# Patient Record
Sex: Female | Born: 1970 | Race: White | Hispanic: No | Marital: Married | Smoking: Current every day smoker
Health system: Southern US, Community
[De-identification: ages and names within clinical notes are randomized; demographics above are authoritative.]

## PROBLEM LIST (undated history)

## (undated) DIAGNOSIS — J439 Emphysema, unspecified: Secondary | ICD-10-CM

## (undated) DIAGNOSIS — D649 Anemia, unspecified: Secondary | ICD-10-CM

## (undated) DIAGNOSIS — Z5189 Encounter for other specified aftercare: Secondary | ICD-10-CM

## (undated) DIAGNOSIS — F4310 Post-traumatic stress disorder, unspecified: Secondary | ICD-10-CM

## (undated) DIAGNOSIS — M1990 Unspecified osteoarthritis, unspecified site: Secondary | ICD-10-CM

## (undated) DIAGNOSIS — M5126 Other intervertebral disc displacement, lumbar region: Secondary | ICD-10-CM

## (undated) DIAGNOSIS — D352 Benign neoplasm of pituitary gland: Secondary | ICD-10-CM

## (undated) DIAGNOSIS — M359 Systemic involvement of connective tissue, unspecified: Secondary | ICD-10-CM

## (undated) DIAGNOSIS — F319 Bipolar disorder, unspecified: Secondary | ICD-10-CM

## (undated) DIAGNOSIS — G629 Polyneuropathy, unspecified: Secondary | ICD-10-CM

## (undated) DIAGNOSIS — G8929 Other chronic pain: Secondary | ICD-10-CM

## (undated) DIAGNOSIS — IMO0002 Reserved for concepts with insufficient information to code with codable children: Secondary | ICD-10-CM

## (undated) DIAGNOSIS — M549 Dorsalgia, unspecified: Secondary | ICD-10-CM

## (undated) DIAGNOSIS — E785 Hyperlipidemia, unspecified: Secondary | ICD-10-CM

## (undated) DIAGNOSIS — K219 Gastro-esophageal reflux disease without esophagitis: Secondary | ICD-10-CM

## (undated) DIAGNOSIS — F410 Panic disorder [episodic paroxysmal anxiety] without agoraphobia: Secondary | ICD-10-CM

## (undated) DIAGNOSIS — M069 Rheumatoid arthritis, unspecified: Secondary | ICD-10-CM

## (undated) HISTORY — PX: DENTAL SURGERY: SHX609

## (undated) HISTORY — DX: Hyperlipidemia, unspecified: E78.5

## (undated) HISTORY — PX: TUBAL LIGATION: SHX77

## (undated) HISTORY — PX: HIP SURGERY: SHX245

## (undated) HISTORY — DX: Emphysema, unspecified: J43.9

## (undated) HISTORY — DX: Anemia, unspecified: D64.9

## (undated) HISTORY — PX: NECK SURGERY: SHX720

## (undated) HISTORY — DX: Post-traumatic stress disorder, unspecified: F43.10

## (undated) HISTORY — DX: Encounter for other specified aftercare: Z51.89

## (undated) HISTORY — DX: Gastro-esophageal reflux disease without esophagitis: K21.9

## (undated) HISTORY — PX: ABDOMINAL SURGERY: SHX537

---

## 2006-10-28 ENCOUNTER — Emergency Department (HOSPITAL_COMMUNITY): Admission: EM | Admit: 2006-10-28 | Discharge: 2006-10-28 | Payer: Self-pay | Admitting: Emergency Medicine

## 2008-05-19 ENCOUNTER — Other Ambulatory Visit: Admission: RE | Admit: 2008-05-19 | Discharge: 2008-05-19 | Payer: Self-pay | Admitting: Obstetrics and Gynecology

## 2008-05-23 ENCOUNTER — Ambulatory Visit (HOSPITAL_COMMUNITY): Admission: RE | Admit: 2008-05-23 | Discharge: 2008-05-23 | Payer: Self-pay | Admitting: Obstetrics and Gynecology

## 2010-03-28 ENCOUNTER — Ambulatory Visit (HOSPITAL_COMMUNITY): Admission: RE | Admit: 2010-03-28 | Discharge: 2010-03-28 | Payer: Self-pay | Admitting: Obstetrics and Gynecology

## 2011-05-20 ENCOUNTER — Other Ambulatory Visit: Payer: Self-pay | Admitting: Obstetrics and Gynecology

## 2011-10-15 ENCOUNTER — Other Ambulatory Visit: Payer: Self-pay | Admitting: Obstetrics and Gynecology

## 2012-01-26 ENCOUNTER — Other Ambulatory Visit: Payer: Self-pay | Admitting: Obstetrics and Gynecology

## 2012-03-08 ENCOUNTER — Other Ambulatory Visit (HOSPITAL_COMMUNITY): Payer: Self-pay | Admitting: Orthopaedic Surgery

## 2012-03-08 DIAGNOSIS — M25569 Pain in unspecified knee: Secondary | ICD-10-CM

## 2012-03-11 ENCOUNTER — Ambulatory Visit (HOSPITAL_COMMUNITY)
Admission: RE | Admit: 2012-03-11 | Discharge: 2012-03-11 | Disposition: A | Discharge status: Home / Self Care | Payer: Medicare Other | Source: Ambulatory Visit | Attending: Orthopaedic Surgery | Admitting: Orthopaedic Surgery

## 2012-03-11 DIAGNOSIS — M25569 Pain in unspecified knee: Secondary | ICD-10-CM

## 2012-05-16 ENCOUNTER — Emergency Department (HOSPITAL_COMMUNITY)
Admission: EM | Admit: 2012-05-16 | Discharge: 2012-05-16 | Disposition: A | Discharge status: Home / Self Care | Payer: Medicare Other | Attending: Emergency Medicine | Admitting: Emergency Medicine

## 2012-05-16 ENCOUNTER — Encounter (HOSPITAL_COMMUNITY): Payer: Self-pay

## 2012-05-16 DIAGNOSIS — R51 Headache: Secondary | ICD-10-CM | POA: Insufficient documentation

## 2012-05-16 DIAGNOSIS — Z8739 Personal history of other diseases of the musculoskeletal system and connective tissue: Secondary | ICD-10-CM | POA: Insufficient documentation

## 2012-05-16 DIAGNOSIS — Z79899 Other long term (current) drug therapy: Secondary | ICD-10-CM | POA: Insufficient documentation

## 2012-05-16 DIAGNOSIS — J40 Bronchitis, not specified as acute or chronic: Secondary | ICD-10-CM

## 2012-05-16 HISTORY — DX: Bipolar disorder, unspecified: F31.9

## 2012-05-16 HISTORY — DX: Unspecified osteoarthritis, unspecified site: M19.90

## 2012-05-16 HISTORY — DX: Reserved for concepts with insufficient information to code with codable children: IMO0002

## 2012-05-16 HISTORY — DX: Panic disorder (episodic paroxysmal anxiety): F41.0

## 2012-05-16 MED ORDER — IPRATROPIUM BROMIDE 0.02 % IN SOLN
0.5000 mg | Freq: Once | RESPIRATORY_TRACT | Status: AC
  Administered 2012-05-16: 0.5 mg via RESPIRATORY_TRACT
  Filled 2012-05-16: qty 2.5

## 2012-05-16 MED ORDER — HYDROCOD POLST-CHLORPHEN POLST 10-8 MG/5ML PO LQCR
5.0000 mL | Freq: Once | ORAL | Status: AC
  Administered 2012-05-16: 5 mL via ORAL
  Filled 2012-05-16: qty 5

## 2012-05-16 MED ORDER — DOXYCYCLINE HYCLATE 100 MG PO CAPS: 100.0000 mg | ORAL_CAPSULE | Freq: Two times a day (BID) | ORAL | Status: DC

## 2012-05-16 MED ORDER — METHYLPREDNISOLONE SODIUM SUCC 125 MG IJ SOLR
125.0000 mg | Freq: Once | INTRAMUSCULAR | Status: AC
  Administered 2012-05-16: 125 mg via INTRAMUSCULAR
  Filled 2012-05-16: qty 2

## 2012-05-16 MED ORDER — PREDNISONE 10 MG PO TABS: ORAL_TABLET | ORAL | Status: DC

## 2012-05-16 MED ORDER — ALBUTEROL SULFATE HFA 108 (90 BASE) MCG/ACT IN AERS: 2.0000 | INHALATION_SPRAY | RESPIRATORY_TRACT | Status: DC

## 2012-05-16 MED ORDER — HYDROCOD POLST-CHLORPHEN POLST 10-8 MG/5ML PO LQCR: 5.0000 mL | Freq: Two times a day (BID) | ORAL | Status: DC | PRN

## 2012-05-16 MED ORDER — ALBUTEROL SULFATE (5 MG/ML) 0.5% IN NEBU
5.0000 mg | INHALATION_SOLUTION | Freq: Once | RESPIRATORY_TRACT | Status: AC
  Administered 2012-05-16: 5 mg via RESPIRATORY_TRACT
  Filled 2012-05-16: qty 1

## 2012-05-16 NOTE — ED Provider Notes (Signed)
Medical screening examination/treatment/procedure(s) were performed by non-physician practitioner and as supervising physician I was immediately available for consultation/collaboration.   Joya Gaskins, MD 05/16/12 [phone]

## 2012-05-16 NOTE — ED Notes (Signed)
Pt reports being sick for 1 1/2 weeks with cough, congestion and sore throat.

## 2012-05-16 NOTE — ED Provider Notes (Signed)
History     CSN: [id number]  Arrival date & time 05/16/12  1235   First MD Initiated Contact with Patient 05/16/12 1331      Chief Complaint  Patient presents with  . Sore Throat  . Cough    (Consider location/radiation/quality/duration/timing/severity/associated sxs/prior treatment) Patient is a 41 y.o. female presenting with cough. The history is provided by the patient.  Cough This is a new problem. The current episode started more than 1 week ago. The problem occurs every few minutes. The problem has been gradually worsening. The cough is productive of sputum. There has been no fever. Associated symptoms include chills, headaches, sore throat, myalgias, shortness of breath and wheezing. Pertinent negatives include no chest pain. She has tried cough syrup and decongestants for the symptoms. The treatment provided no relief. She is a smoker. Her past medical history is significant for pneumonia.    Past Medical History  Diagnosis Date  . Cancer   . Arthritis   . DDD (degenerative disc disease)   . Bipolar 1 disorder   . Panic attacks     Past Surgical History  Procedure Date  . Abdominal surgery   . Tubal ligation     No family history on file.  History  Substance Use Topics  . Smoking status: Current Every Day Smoker    Types: Cigarettes  . Smokeless tobacco: Not on file  . Alcohol Use: No    OB History    Grav Para Term Preterm Abortions TAB SAB Ect Mult Living                  Review of Systems  Constitutional: Positive for chills. Negative for activity change.       All ROS Neg except as noted in HPI  HENT: Positive for sore throat. Negative for nosebleeds and neck pain.   Eyes: Negative for photophobia and discharge.  Respiratory: Positive for cough, shortness of breath and wheezing.   Cardiovascular: Negative for chest pain and palpitations.  Gastrointestinal: Negative for abdominal pain and blood in stool.  Genitourinary: Negative for dysuria,  frequency and hematuria.  Musculoskeletal: Positive for myalgias. Negative for back pain and arthralgias.  Skin: Negative.   Neurological: Positive for headaches. Negative for dizziness, seizures and speech difficulty.  Psychiatric/Behavioral: Negative for hallucinations and confusion.    Allergies  Codeine  Home Medications   Current Outpatient Rx  Name Route Sig Dispense Refill  . ALPRAZOLAM 0.5 MG PO TABS Oral Take 1 tablet by mouth 4 times daily.    . ARIPIPRAZOLE 5 MG PO TABS Oral Take 5 mg by mouth daily.    Marland Kitchen DEXTROMETHORPHAN POLISTIREX ER 30 MG/5ML PO LQCR Oral Take 50 mg by mouth every 4 (four) hours as needed. Cough.    . OXYCODONE HCL 5 MG PO TABS Oral Take 1 tablet by mouth 4 (four) times daily as needed. Breakthrough pain.    Marland Kitchen PHENTERMINE HCL 37.5 MG PO TABS Oral Take 37.5 mg by mouth daily before breakfast.    . MUCINEX FAST-MAX COLD FLU PO Oral Take 1 tablet by mouth 2 (two) times daily.    . ALBUTEROL SULFATE HFA 108 (90 BASE) MCG/ACT IN AERS Inhalation Inhale 2 puffs into the lungs every 4 (four) hours. 1 Inhaler 0  . HYDROCOD POLST-CPM POLST ER 10-8 MG/5ML PO LQCR Oral Take 5 mLs by mouth every 12 (twelve) hours as needed. 140 mL 0  . DOXYCYCLINE HYCLATE 100 MG PO CAPS Oral Take 1 capsule (100  mg total) by mouth 2 (two) times daily. 14 capsule 0  . FENTANYL 50 MCG/HR TD PT72 Oral Take 1 patch by mouth every 3 (three) days.    Marland Kitchen PREDNISONE 10 MG PO TABS  6,5,4,3,2,1 -take with food or 15 tablet 0  . VIVELLE-DOT 0.1 MG/24HR TD PTTW Oral Take 1 patch by mouth 4 days.      BP 126/82  Pulse 98  Temp 98.4 F (36.9 C) (Oral)  Resp 20  Ht 5\' 2"  (1.575 m)  Wt 144 lb (65.318 kg)  BMI 26.34 kg/m2  SpO2 100%  Physical Exam  Nursing note and vitals reviewed. Constitutional: She is oriented to person, place, and time. She appears well-developed and well-nourished.  Non-toxic appearance.  HENT:  Head: Normocephalic.  Right Ear: Tympanic membrane and external ear normal.   Left Ear: Tympanic membrane and external ear normal.  Eyes: EOM and lids are normal. Pupils are equal, round, and reactive to light.  Neck: Normal range of motion. Neck supple. Carotid bruit is not present.  Cardiovascular: Normal rate, regular rhythm, normal heart sounds, intact distal pulses and normal pulses.   Pulmonary/Chest: No respiratory distress. She has wheezes. She has rhonchi.  Abdominal: Soft. Bowel sounds are normal. There is no tenderness. There is no guarding.  Musculoskeletal: Normal range of motion.  Lymphadenopathy:       Head (right side): No submandibular adenopathy present.       Head (left side): No submandibular adenopathy present.    She has no cervical adenopathy.  Neurological: She is alert and oriented to person, place, and time. She has normal strength. No cranial nerve deficit or sensory deficit.  Skin: Skin is warm and dry.  Psychiatric: She has a normal mood and affect. Her speech is normal.    ED Course  Procedures (including critical care time)  Labs Reviewed - No data to display No results found.   1. Bronchitis       MDM  I have reviewed nursing notes, vital signs, and all appropriate lab and imaging results for this patient. Wheezing and cough improved after nebulizer treatment and cough medication. Patient able to ambulate to the bathroom and back to the room without any major problem. The patient is treated with doxycycline, prednisone taper, Tussionex, and albuterol. The patient is to see her primary physician or return to the emergency department if not improving.       Kathie Dike, [place] 05/16/12 [phone]

## 2012-06-05 ENCOUNTER — Encounter (HOSPITAL_COMMUNITY): Payer: Self-pay | Admitting: *Deleted

## 2012-06-05 ENCOUNTER — Emergency Department (HOSPITAL_COMMUNITY)
Admission: EM | Admit: 2012-06-05 | Discharge: 2012-06-05 | Disposition: A | Discharge status: Home / Self Care | Payer: Medicare Other | Attending: Emergency Medicine | Admitting: Emergency Medicine

## 2012-06-05 ENCOUNTER — Emergency Department (HOSPITAL_COMMUNITY): Payer: Medicare Other

## 2012-06-05 DIAGNOSIS — F17200 Nicotine dependence, unspecified, uncomplicated: Secondary | ICD-10-CM | POA: Insufficient documentation

## 2012-06-05 DIAGNOSIS — Y93I9 Activity, other involving external motion: Secondary | ICD-10-CM | POA: Insufficient documentation

## 2012-06-05 DIAGNOSIS — R109 Unspecified abdominal pain: Secondary | ICD-10-CM | POA: Insufficient documentation

## 2012-06-05 DIAGNOSIS — F309 Manic episode, unspecified: Secondary | ICD-10-CM | POA: Insufficient documentation

## 2012-06-05 DIAGNOSIS — R079 Chest pain, unspecified: Secondary | ICD-10-CM | POA: Insufficient documentation

## 2012-06-05 DIAGNOSIS — G8929 Other chronic pain: Secondary | ICD-10-CM | POA: Insufficient documentation

## 2012-06-05 DIAGNOSIS — S40019A Contusion of unspecified shoulder, initial encounter: Secondary | ICD-10-CM | POA: Insufficient documentation

## 2012-06-05 DIAGNOSIS — M546 Pain in thoracic spine: Secondary | ICD-10-CM | POA: Insufficient documentation

## 2012-06-05 DIAGNOSIS — S40011A Contusion of right shoulder, initial encounter: Secondary | ICD-10-CM

## 2012-06-05 DIAGNOSIS — S301XXA Contusion of abdominal wall, initial encounter: Secondary | ICD-10-CM | POA: Insufficient documentation

## 2012-06-05 DIAGNOSIS — F410 Panic disorder [episodic paroxysmal anxiety] without agoraphobia: Secondary | ICD-10-CM | POA: Insufficient documentation

## 2012-06-05 DIAGNOSIS — S20219A Contusion of unspecified front wall of thorax, initial encounter: Secondary | ICD-10-CM

## 2012-06-05 DIAGNOSIS — Y939 Activity, unspecified: Secondary | ICD-10-CM | POA: Insufficient documentation

## 2012-06-05 DIAGNOSIS — Z79899 Other long term (current) drug therapy: Secondary | ICD-10-CM | POA: Insufficient documentation

## 2012-06-05 HISTORY — DX: Other chronic pain: G89.29

## 2012-06-05 HISTORY — DX: Dorsalgia, unspecified: M54.9

## 2012-06-05 LAB — COMPREHENSIVE METABOLIC PANEL
AST: 13 U/L (ref 0–37)
Alkaline Phosphatase: 77 U/L (ref 39–117)
CO2: 24 mEq/L (ref 19–32)
Chloride: 107 mEq/L (ref 96–112)
GFR calc Af Amer: >90 mL/min (ref >90)
GFR calc non Af Amer: >90 mL/min (ref >90)
Glucose, Bld: 107 mg/dL — ABNORMAL HIGH (ref 70–99)
Potassium: 3.7 mEq/L (ref 3.5–5.1)
Total Protein: 6.4 g/dL (ref 6.0–8.3)

## 2012-06-05 LAB — CBC WITH DIFFERENTIAL/PLATELET
Basophils Absolute: 0 10*3/uL (ref 0.0–0.1)
Lymphocytes Relative: 24 % (ref 12–46)
Lymphs Abs: 1.9 10*3/uL (ref 0.7–4.0)
MCHC: 33.1 g/dL (ref 30.0–36.0)
MCV: 95.9 fL (ref 78.0–100.0)
Neutro Abs: 5.5 10*3/uL (ref 1.7–7.7)
WBC: 8 10*3/uL (ref 4.0–10.5)

## 2012-06-05 MED ORDER — CYCLOBENZAPRINE HCL 10 MG PO TABS: 10.0000 mg | ORAL_TABLET | Freq: Three times a day (TID) | ORAL | Status: DC | PRN

## 2012-06-05 MED ORDER — IOHEXOL 300 MG/ML  SOLN
100.0000 mL | Freq: Once | INTRAMUSCULAR | Status: AC | PRN
  Administered 2012-06-05: 100 mL via INTRAVENOUS

## 2012-06-05 MED ORDER — SODIUM CHLORIDE 0.9 % IV BOLUS (SEPSIS)
1000.0000 mL | Freq: Once | INTRAVENOUS | Status: AC
  Administered 2012-06-05: 1000 mL via INTRAVENOUS
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
## 2012-06-05 NOTE — ED Notes (Signed)
Pt discharged. Pt stable at time of discharge. Medications reviewed pt has no questions regarding discharge at this time. Pt voiced understanding of discharge instructions.  

## 2012-11-27 ENCOUNTER — Other Ambulatory Visit (HOSPITAL_COMMUNITY): Payer: Self-pay | Admitting: Obstetrics and Gynecology

## 2012-11-27 DIAGNOSIS — R55 Syncope and collapse: Secondary | ICD-10-CM

## 2012-11-30 ENCOUNTER — Other Ambulatory Visit: Payer: Self-pay | Admitting: Obstetrics and Gynecology

## 2012-11-30 DIAGNOSIS — R55 Syncope and collapse: Secondary | ICD-10-CM

## 2012-12-10 ENCOUNTER — Encounter (INDEPENDENT_AMBULATORY_CARE_PROVIDER_SITE_OTHER): Payer: Self-pay

## 2012-12-16 ENCOUNTER — Ambulatory Visit (INDEPENDENT_AMBULATORY_CARE_PROVIDER_SITE_OTHER): Payer: Medicare Other | Admitting: Surgery

## 2012-12-30 ENCOUNTER — Ambulatory Visit (INDEPENDENT_AMBULATORY_CARE_PROVIDER_SITE_OTHER): Payer: Medicare Other | Admitting: Surgery

## 2013-02-04 ENCOUNTER — Emergency Department (HOSPITAL_COMMUNITY)
Admission: EM | Admit: 2013-02-04 | Discharge: 2013-02-04 | Disposition: A | Discharge status: Home / Self Care | Payer: Medicare Other | Attending: Emergency Medicine | Admitting: Emergency Medicine

## 2013-02-04 ENCOUNTER — Encounter (HOSPITAL_COMMUNITY): Payer: Self-pay | Admitting: *Deleted

## 2013-02-04 ENCOUNTER — Ambulatory Visit: Payer: Medicare Other | Admitting: Family

## 2013-02-04 DIAGNOSIS — M129 Arthropathy, unspecified: Secondary | ICD-10-CM | POA: Insufficient documentation

## 2013-02-04 DIAGNOSIS — Z760 Encounter for issue of repeat prescription: Secondary | ICD-10-CM

## 2013-02-04 DIAGNOSIS — Z79899 Other long term (current) drug therapy: Secondary | ICD-10-CM | POA: Insufficient documentation

## 2013-02-04 DIAGNOSIS — G8929 Other chronic pain: Secondary | ICD-10-CM | POA: Insufficient documentation

## 2013-02-04 DIAGNOSIS — F419 Anxiety disorder, unspecified: Secondary | ICD-10-CM

## 2013-02-04 DIAGNOSIS — R000 Tachycardia, unspecified: Secondary | ICD-10-CM | POA: Insufficient documentation

## 2013-02-04 DIAGNOSIS — F411 Generalized anxiety disorder: Secondary | ICD-10-CM | POA: Insufficient documentation

## 2013-02-04 DIAGNOSIS — F319 Bipolar disorder, unspecified: Secondary | ICD-10-CM | POA: Insufficient documentation

## 2013-02-04 DIAGNOSIS — M549 Dorsalgia, unspecified: Secondary | ICD-10-CM | POA: Insufficient documentation

## 2013-02-04 DIAGNOSIS — Z885 Allergy status to narcotic agent status: Secondary | ICD-10-CM | POA: Insufficient documentation

## 2013-02-04 DIAGNOSIS — IMO0002 Reserved for concepts with insufficient information to code with codable children: Secondary | ICD-10-CM | POA: Insufficient documentation

## 2013-02-04 DIAGNOSIS — F17200 Nicotine dependence, unspecified, uncomplicated: Secondary | ICD-10-CM | POA: Insufficient documentation

## 2013-02-04 DIAGNOSIS — Z859 Personal history of malignant neoplasm, unspecified: Secondary | ICD-10-CM | POA: Insufficient documentation

## 2013-02-04 MED ORDER — CYCLOBENZAPRINE HCL 10 MG PO TABS: 10.0000 mg | ORAL_TABLET | Freq: Two times a day (BID) | ORAL | Status: DC | PRN

## 2013-02-04 NOTE — ED Provider Notes (Signed)
Medical screening examination/treatment/procedure(s) were performed by non-physician practitioner and as supervising physician I was immediately available for consultation/collaboration.   Bryce Kimble, MD 02/04/13 2339 
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
## 2013-02-25 ENCOUNTER — Ambulatory Visit (INDEPENDENT_AMBULATORY_CARE_PROVIDER_SITE_OTHER): Payer: Medicare Other | Admitting: Surgery

## 2013-03-11 ENCOUNTER — Ambulatory Visit (INDEPENDENT_AMBULATORY_CARE_PROVIDER_SITE_OTHER): Payer: Medicare Other | Admitting: Surgery

## 2013-03-11 ENCOUNTER — Encounter (INDEPENDENT_AMBULATORY_CARE_PROVIDER_SITE_OTHER): Payer: Self-pay | Admitting: Surgery

## 2013-03-11 VITALS — BP 128/66 | HR 92 | Temp 97.0°F | Resp 15 | Ht 61.5 in | Wt 161.0 lb

## 2013-03-11 DIAGNOSIS — R1011 Right upper quadrant pain: Secondary | ICD-10-CM

## 2013-03-11 NOTE — Progress Notes (Signed)
Patient ID: Jenna Zamora, female   DOB: 02/15/1971, 42 y.o.   MRN: [id number]  Chief Complaint  Patient presents with  . Other    chest wall mass    HPI Jenna Zamora is a 42 y.o. female.   HPI This patient is initially  Referred by Dr. Samuel Jester. However, she has just been fired by the practice. I am seeing her for a possible chest wall mass. She has multiple chronic complaints. 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 She complains of chronic right upper quadrant rib and abdominal wall muscular pain. Her most recent CAT scan showed no abnormalities in this area of concern. Past Medical History  Diagnosis Date  . Cancer   . Arthritis   . DDD (degenerative disc disease)   . Bipolar 1 disorder   . Panic attacks   . Chronic back pain   . Anemia   . Blood transfusion without reported diagnosis   . Emphysema of lung   . GERD (gastroesophageal reflux disease)   . Hyperlipidemia   . PTSD (post-traumatic stress disorder)     Past Surgical History  Procedure Laterality Date  . Abdominal surgery    . Tubal ligation      Family History  Problem Relation Age of Onset  . Cancer Mother     ovarian  . Heart disease Mother   . Cancer Maternal Aunt     breast  . Cancer Maternal Grandmother     ovarian    Social History History  Substance Use Topics  . Smoking status: Current Every Day Smoker    Types: Cigarettes  . Smokeless tobacco: Never Used  . Alcohol Use: No    Allergies  Allergen Reactions  . Codeine Hives and Itching    Current Outpatient Prescriptions  Medication Sig Dispense Refill  . ALPRAZolam (XANAX) 1 MG tablet Take 1 mg by mouth 4 (four) times daily - after meals and at bedtime.      . celecoxib (CELEBREX) 200 MG capsule Take 200 mg by mouth 2 (two) times daily.      . citalopram (CELEXA) 40 MG tablet Take 40 mg by mouth daily.      . cyclobenzaprine (FLEXERIL) 10 MG tablet Take 1  tablet (10 mg total) by mouth 2 (two) times daily as needed for muscle spasms.  20 tablet  0  . fentaNYL (DURAGESIC - DOSED MCG/HR) 50 MCG/HR Take 1 patch by mouth every 3 (three) days.      Marland Kitchen oxyCODONE (OXY IR/ROXICODONE) 5 MG immediate release tablet Take 1 tablet by mouth 4 (four) times daily as needed. Breakthrough pain.      . phentermine (ADIPEX-P) 37.5 MG tablet Take 37.5 mg by mouth daily before breakfast.      . PROGESTERONE MICRONIZED PO Take 100 each by mouth 1 day or 1 dose.      Marland Kitchen rOPINIRole (REQUIP) 0.25 MG tablet Take 0.25 mg by mouth 3 (three) times daily.      . temazepam (RESTORIL) 30 MG capsule Take 30 mg by mouth at bedtime as needed for sleep.      Marland Kitchen VIVELLE-DOT 0.1 MG/24HR APPLY AS PER PACKAGE DIRECTIONS  8 patch  8  . ARIPiprazole (ABILIFY) 5 MG tablet Take 5 mg by mouth daily.       No current facility-administered medications for this visit.    Review of Systems Review of Systems  Unable to perform ROS  Blood pressure 128/66, pulse 92, temperature 97 F (36.1 C), temperature source Temporal, resp. rate 15, height 5' 1.5" (1.562 m), weight 161 lb (73.029 kg).  Physical Exam Physical Exam  Pulmonary/Chest:  In the area of concern, she is mildly full along her right rib costal margin and abdominal wall. There may be a slight lipoma present although this feels like more of an asymmetry of her abdominal wall fat and musculature    Data Reviewed I reviewed her most recent CAT scan which showed no abnormalities in the area of palpable concern  Assessment    Abdominal wall pain     Plan    She has significant chronic pain issues. There is no evidence that there is any malignancy in this area on CAT scan. I would not favor any type of surgery here as I believe she would still have significant discomfort regardless. Again. This may just be chronic muscular skeletal pain and not even a lipoma. I tried to explain this to her. I will see her as needed         Kania Regnier A 03/11/2013, 1:59 PM

## 2013-03-20 ENCOUNTER — Emergency Department (HOSPITAL_COMMUNITY)
Admission: EM | Admit: 2013-03-20 | Discharge: 2013-03-20 | Disposition: A | Discharge status: Home / Self Care | Payer: Medicare Other | Attending: Emergency Medicine | Admitting: Emergency Medicine

## 2013-03-20 ENCOUNTER — Encounter (HOSPITAL_COMMUNITY): Payer: Self-pay | Admitting: *Deleted

## 2013-03-20 DIAGNOSIS — Z8659 Personal history of other mental and behavioral disorders: Secondary | ICD-10-CM | POA: Insufficient documentation

## 2013-03-20 DIAGNOSIS — M542 Cervicalgia: Secondary | ICD-10-CM | POA: Insufficient documentation

## 2013-03-20 DIAGNOSIS — M25539 Pain in unspecified wrist: Secondary | ICD-10-CM | POA: Insufficient documentation

## 2013-03-20 DIAGNOSIS — Z8739 Personal history of other diseases of the musculoskeletal system and connective tissue: Secondary | ICD-10-CM | POA: Insufficient documentation

## 2013-03-20 DIAGNOSIS — F17200 Nicotine dependence, unspecified, uncomplicated: Secondary | ICD-10-CM | POA: Insufficient documentation

## 2013-03-20 DIAGNOSIS — G8929 Other chronic pain: Secondary | ICD-10-CM

## 2013-03-20 DIAGNOSIS — M25529 Pain in unspecified elbow: Secondary | ICD-10-CM | POA: Insufficient documentation

## 2013-03-20 DIAGNOSIS — Z8639 Personal history of other endocrine, nutritional and metabolic disease: Secondary | ICD-10-CM | POA: Insufficient documentation

## 2013-03-20 DIAGNOSIS — Z862 Personal history of diseases of the blood and blood-forming organs and certain disorders involving the immune mechanism: Secondary | ICD-10-CM | POA: Insufficient documentation

## 2013-03-20 DIAGNOSIS — K219 Gastro-esophageal reflux disease without esophagitis: Secondary | ICD-10-CM | POA: Insufficient documentation

## 2013-03-20 DIAGNOSIS — Z859 Personal history of malignant neoplasm, unspecified: Secondary | ICD-10-CM | POA: Insufficient documentation

## 2013-03-20 DIAGNOSIS — Z8709 Personal history of other diseases of the respiratory system: Secondary | ICD-10-CM | POA: Insufficient documentation

## 2013-03-20 DIAGNOSIS — Z79899 Other long term (current) drug therapy: Secondary | ICD-10-CM | POA: Insufficient documentation

## 2013-03-20 DIAGNOSIS — R109 Unspecified abdominal pain: Secondary | ICD-10-CM | POA: Insufficient documentation

## 2013-03-20 DIAGNOSIS — F319 Bipolar disorder, unspecified: Secondary | ICD-10-CM | POA: Insufficient documentation

## 2013-03-20 DIAGNOSIS — Z9889 Other specified postprocedural states: Secondary | ICD-10-CM | POA: Insufficient documentation

## 2013-03-20 DIAGNOSIS — R000 Tachycardia, unspecified: Secondary | ICD-10-CM | POA: Insufficient documentation

## 2013-03-20 DIAGNOSIS — F410 Panic disorder [episodic paroxysmal anxiety] without agoraphobia: Secondary | ICD-10-CM | POA: Insufficient documentation

## 2013-03-20 DIAGNOSIS — M25559 Pain in unspecified hip: Secondary | ICD-10-CM | POA: Insufficient documentation

## 2013-03-20 DIAGNOSIS — Z9851 Tubal ligation status: Secondary | ICD-10-CM | POA: Insufficient documentation

## 2013-03-20 DIAGNOSIS — R071 Chest pain on breathing: Secondary | ICD-10-CM | POA: Insufficient documentation

## 2013-03-20 MED ORDER — FENTANYL 50 MCG/HR TD PT72
50.0000 ug | MEDICATED_PATCH | TRANSDERMAL | Status: DC
  Administered 2013-03-20: 50 ug via TRANSDERMAL
  Filled 2013-03-20: qty 2

## 2013-03-20 MED ORDER — FENTANYL CITRATE 0.05 MG/ML IJ SOLN
50.0000 ug | Freq: Once | INTRAMUSCULAR | Status: DC
  Filled 2013-03-20: qty 2

## 2013-03-20 MED ORDER — TEMAZEPAM 30 MG PO CAPS: 30.0000 mg | ORAL_CAPSULE | Freq: Every evening | ORAL | Status: DC | PRN

## 2013-03-20 MED ORDER — OXYCODONE HCL 5 MG PO TABS: 5.0000 mg | ORAL_TABLET | Freq: Four times a day (QID) | ORAL | Status: DC | PRN

## 2013-03-20 NOTE — ED Notes (Signed)
Pt c/o chronic pain in abdomen, and back. States she is on her last pain patch.

## 2013-03-20 NOTE — ED Provider Notes (Signed)
Medical screening examination/treatment/procedure(s) were performed by non-physician practitioner and as supervising physician I was immediately available for consultation/collaboration.   Amrit Erck M Earle Troiano, MD 03/20/13 0832 

## 2013-03-20 NOTE — ED Notes (Signed)
Pt states that she is here for pain prescriptin refill. All symptoms that she is currently is experiencing is not new
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
## 2013-03-21 ENCOUNTER — Other Ambulatory Visit: Payer: Self-pay | Admitting: Obstetrics and Gynecology

## 2013-03-23 ENCOUNTER — Emergency Department (HOSPITAL_COMMUNITY)
Admission: EM | Admit: 2013-03-23 | Discharge: 2013-03-23 | Disposition: A | Discharge status: Home / Self Care | Payer: Medicare Other | Attending: Emergency Medicine | Admitting: Emergency Medicine

## 2013-03-23 ENCOUNTER — Encounter (HOSPITAL_COMMUNITY): Payer: Self-pay | Admitting: Emergency Medicine

## 2013-03-23 DIAGNOSIS — M545 Low back pain, unspecified: Secondary | ICD-10-CM | POA: Insufficient documentation

## 2013-03-23 DIAGNOSIS — M129 Arthropathy, unspecified: Secondary | ICD-10-CM | POA: Insufficient documentation

## 2013-03-23 DIAGNOSIS — F410 Panic disorder [episodic paroxysmal anxiety] without agoraphobia: Secondary | ICD-10-CM | POA: Insufficient documentation

## 2013-03-23 DIAGNOSIS — IMO0002 Reserved for concepts with insufficient information to code with codable children: Secondary | ICD-10-CM | POA: Insufficient documentation

## 2013-03-23 DIAGNOSIS — Z5189 Encounter for other specified aftercare: Secondary | ICD-10-CM | POA: Insufficient documentation

## 2013-03-23 DIAGNOSIS — F17200 Nicotine dependence, unspecified, uncomplicated: Secondary | ICD-10-CM | POA: Insufficient documentation

## 2013-03-23 DIAGNOSIS — D649 Anemia, unspecified: Secondary | ICD-10-CM | POA: Insufficient documentation

## 2013-03-23 DIAGNOSIS — F319 Bipolar disorder, unspecified: Secondary | ICD-10-CM | POA: Insufficient documentation

## 2013-03-23 DIAGNOSIS — Z8589 Personal history of malignant neoplasm of other organs and systems: Secondary | ICD-10-CM | POA: Insufficient documentation

## 2013-03-23 DIAGNOSIS — J438 Other emphysema: Secondary | ICD-10-CM | POA: Insufficient documentation

## 2013-03-23 DIAGNOSIS — K219 Gastro-esophageal reflux disease without esophagitis: Secondary | ICD-10-CM | POA: Insufficient documentation

## 2013-03-23 DIAGNOSIS — E785 Hyperlipidemia, unspecified: Secondary | ICD-10-CM | POA: Insufficient documentation

## 2013-03-23 DIAGNOSIS — G8929 Other chronic pain: Secondary | ICD-10-CM | POA: Insufficient documentation

## 2013-03-23 DIAGNOSIS — Z79899 Other long term (current) drug therapy: Secondary | ICD-10-CM | POA: Insufficient documentation

## 2013-03-23 DIAGNOSIS — Z885 Allergy status to narcotic agent status: Secondary | ICD-10-CM | POA: Insufficient documentation

## 2013-03-23 DIAGNOSIS — Z760 Encounter for issue of repeat prescription: Secondary | ICD-10-CM

## 2013-03-23 DIAGNOSIS — F4310 Post-traumatic stress disorder, unspecified: Secondary | ICD-10-CM | POA: Insufficient documentation

## 2013-03-23 MED ORDER — FENTANYL 25 MCG/HR TD PT72
50.0000 ug | MEDICATED_PATCH | TRANSDERMAL | Status: DC
  Administered 2013-03-23: 50 ug via TRANSDERMAL
  Filled 2013-03-23: qty 2

## 2013-03-23 NOTE — ED Notes (Signed)
Patient to [facility] ED with intractable back pain.  States that she will not be able to walk when her fentanyl patch runs out.  States that she needs a refill on her fentanyl patch.

## 2013-03-23 NOTE — ED Notes (Signed)
PT. REQUESTING PRESCRIPTION FOR FENTANYL PATCH FOR HER CHRONIC LOW BACK PAIN , DENIES RECENT FALL OR INJURY , AMBULATORY .

## 2013-03-23 NOTE — ED Provider Notes (Signed)
CSN: [id number]     Arrival date & time 03/23/13  2016 History  This chart was scribed for Dierdre Forth, PA working with Shon Baton, MD by Quintella Reichert, ED Scribe. This patient was seen in room TR05C/TR05C and the patient's care was started at 9:55 PM.    Chief Complaint  Patient presents with  . Medication Refill    The history is provided by the patient. No language interpreter was used.    HPI Comments: Jenna Zamora is a 42 y.o. female with h/o chronic lower back pain, degenerative disc disease, arthritis, and fibrosarcoma of the abdominal wall who presents to the Emergency Department for a fentanyl patch refill for her chronic back pain.  Pt states that she has been on 50 mcg Fentanyl patches for the past 7 years and is unable to ambulate without them.  She ran out of her patches today and states her pain is beginning to grow more severe.  She is between primary care providers and is not receiving treatment at a pain management clinic.  She states that she has been rejected from multiple pain clinics because of the complexity and uniqueness of her medical history.  She is going to [facility] September [facility]th.   Past Medical History  Diagnosis Date  . Cancer   . Arthritis   . DDD (degenerative disc disease)   . Bipolar 1 disorder   . Panic attacks   . Chronic back pain   . Anemia   . Blood transfusion without reported diagnosis   . Emphysema of lung   . GERD (gastroesophageal reflux disease)   . Hyperlipidemia   . PTSD (post-traumatic stress disorder)     Past Surgical History  Procedure Laterality Date  . Abdominal surgery    . Tubal ligation    . Back surgery    . Hip surgery      Family History  Problem Relation Age of Onset  . Cancer Mother     ovarian  . Heart disease Mother   . Cancer Maternal Aunt     breast  . Cancer Maternal Grandmother     ovarian    History  Substance Use Topics  . Smoking status: Current Every Day Smoker     Types: Cigarettes  . Smokeless tobacco: Never Used  . Alcohol Use: No    OB History   Grav Para Term Preterm Abortions TAB SAB Ect Mult Living                   Review of Systems  Constitutional: Negative for fever, diaphoresis, appetite change, fatigue and unexpected weight change.  HENT: Negative for mouth sores and neck stiffness.   Eyes: Negative for visual disturbance.  Respiratory: Negative for cough, chest tightness, shortness of breath and wheezing.   Cardiovascular: Negative for chest pain.  Gastrointestinal: Negative for nausea, vomiting, abdominal pain, diarrhea and constipation.  Endocrine: Negative for polydipsia, polyphagia and polyuria.  Genitourinary: Negative for dysuria, urgency, frequency and hematuria.  Musculoskeletal: Positive for back pain.  Skin: Negative for rash.  Allergic/Immunologic: Negative for immunocompromised state.  Neurological: Negative for syncope, light-headedness and headaches.  Hematological: Does not bruise/bleed easily.  Psychiatric/Behavioral: Negative for sleep disturbance. The patient is not nervous/anxious.       Allergies  Codeine  Home Medications   Current Outpatient Rx  Name  Route  Sig  Dispense  Refill  . ALPRAZolam (XANAX) 1 MG tablet   Oral   Take 1  mg by mouth 3 (three) times daily.          . ARIPiprazole (ABILIFY) 5 MG tablet   Oral   Take 5 mg by mouth daily.         . cyclobenzaprine (FLEXERIL) 10 MG tablet   Oral   Take 1 tablet (10 mg total) by mouth 2 (two) times daily as needed for muscle spasms.   20 tablet   0   . estradiol (VIVELLE-DOT) 0.1 MG/24HR patch   Transdermal   Place 1 patch onto the skin every 3 (three) days.         . fentaNYL (DURAGESIC - DOSED MCG/HR) 50 MCG/HR   Oral   Take 1 patch by mouth every 3 (three) days.         Marland Kitchen oxyCODONE (OXY IR/ROXICODONE) 5 MG immediate release tablet   Oral   Take 1 tablet (5 mg total) by mouth every 6 (six) hours as needed. Breakthrough  pain.   8 tablet   0   . phentermine (ADIPEX-P) 37.5 MG tablet   Oral   Take 37.5 mg by mouth daily before breakfast.         . progesterone (PROMETRIUM) 100 MG capsule   Oral   Take 200 mg by mouth at bedtime.         . temazepam (RESTORIL) 30 MG capsule   Oral   Take 1 capsule (30 mg total) by mouth at bedtime as needed for sleep.   4 capsule   0    BP 120/89  Pulse 96  Temp(Src) 98.3 F (36.8 C) (Oral)  Resp 16  SpO2 99%  Physical Exam  Nursing note and vitals reviewed. Constitutional: She appears well-developed and well-nourished. No distress.  Awake, alert, nontoxic appearance  HENT:  Head: Normocephalic and atraumatic.  Mouth/Throat: Oropharynx is clear and moist. No oropharyngeal exudate.  Eyes: Conjunctivae are normal. No scleral icterus.  Neck: Normal range of motion. Neck supple.  Cardiovascular: Normal rate, regular rhythm, normal heart sounds and intact distal pulses.   No murmur heard. Pulmonary/Chest: Effort normal and breath sounds normal. No respiratory distress. She has no wheezes. She has no rales.  Musculoskeletal: Normal range of motion. She exhibits no edema.  Neurological: She is alert.  Speech is clear and goal oriented Moves extremities without ataxia  Skin: Skin is warm and dry. She is not diaphoretic.  Psychiatric: She has a normal mood and affect.    ED Course  Procedures (including critical care time)  DIAGNOSTIC STUDIES: Oxygen Saturation is 99% on room air, normal by my interpretation.    COORDINATION OF CARE: 10:03 PM-Discussed treatment plan which includes fentanyl patch and follow up with [facility] for further pain management with pt at bedside and pt agreed to plan.    Labs Review Labs Reviewed - No data to display  Imaging Review No results found.  MDM   1. Medication refill    Jenna Zamora presents requesting refills on her fentanyl patch, phentermine, Restoril and OxyIR.  Record review shows the patient was  given refills of Restoril and oxycodone on 03/20/2013. She was not given a refill for fentanyl patch at that time but one was placed here in the department.  I discussed at length with the patient that this emergency department cannot and will not continue to refill her pain medications. I will replace her fentanyl patch today I will not refill her Restoril or oxycodone.  I have given her further resources  for pain clinics and primary care followup. I have also suggested that she contact [facility] to refill her prescription if this is indeed who request that she take it. Patient does not exhibit signs of opiate withdrawal at this time. She is afebrile, non-tachycardic noncystic, nontoxic appearing, without diaphoresis or tremors.  It has been determined that no acute conditions requiring further emergency intervention are present at this time. The patient/guardian have been advised of the diagnosis and plan. We have discussed signs and symptoms that warrant return to the ED, such as changes or worsening in symptoms.   Vital signs are stable at discharge.   BP 120/89  Pulse 96  Temp(Src) 98.3 F (36.8 C) (Oral)  Resp 16  SpO2 99%  Patient/guardian has voiced understanding and agreed to follow-up with the PCP or specialist.  I personally performed the services described in this documentation, which was scribed in my presence. The recorded information has been reviewed and is accurate.       Dahlia Client Lamyah Creed, PA-C 03/24/13 0013

## 2013-03-24 NOTE — ED Provider Notes (Signed)
Medical screening examination/treatment/procedure(s) were performed by non-physician practitioner and as supervising physician I was immediately available for consultation/collaboration.  Shon Baton, MD 03/24/13 [phone]

## 2013-04-06 ENCOUNTER — Ambulatory Visit: Payer: Self-pay | Admitting: Nurse Practitioner

## 2013-04-11 ENCOUNTER — Ambulatory Visit (INDEPENDENT_AMBULATORY_CARE_PROVIDER_SITE_OTHER): Payer: Medicare Other | Admitting: Nurse Practitioner

## 2013-04-11 ENCOUNTER — Encounter: Payer: Self-pay | Admitting: Nurse Practitioner

## 2013-04-11 ENCOUNTER — Telehealth: Payer: Self-pay | Admitting: Nurse Practitioner

## 2013-04-11 VITALS — BP 143/90 | HR 92 | Temp 97.3°F | Wt 157.0 lb

## 2013-04-11 DIAGNOSIS — R1901 Right upper quadrant abdominal swelling, mass and lump: Secondary | ICD-10-CM

## 2013-04-11 MED ORDER — ALPRAZOLAM 1 MG PO TABS: 1.0000 mg | ORAL_TABLET | Freq: Three times a day (TID) | ORAL | Status: DC

## 2013-04-11 MED ORDER — TEMAZEPAM 30 MG PO CAPS: 30.0000 mg | ORAL_CAPSULE | Freq: Every evening | ORAL | Status: DC | PRN

## 2013-04-11 NOTE — Telephone Encounter (Signed)
Please advise 

## 2013-04-11 NOTE — Addendum Note (Signed)
Addended by: Bennie Pierini on: 04/11/2013 11:16 AM   Modules accepted: Orders

## 2013-04-11 NOTE — Progress Notes (Signed)
  Subjective:    Patient ID: Jenna Zamora, female    DOB: [birth year], 42 y.o.   MRN: [id number]  HPI Patient in to discuss tumor- was the size of a softball last week- went to [facility] for follow UP HAS HIS OF ABDOMINAL WALL SARCOMA- They did blood work- told her she reoccurence of sarcoma- went some where in [place] and had a "Healing" tea and since then her tumor has gone- she has stooped fentanyl patches that she hgas been on for years and she says she feels great- No mass - no pain   Review of Systems  All other systems reviewed and are negative.       Objective:   Physical Exam  Constitutional: She appears well-developed and well-nourished.  Cardiovascular: Normal rate, regular rhythm and normal heart sounds.   Pulmonary/Chest: Effort normal and breath sounds normal.  Abdominal: She exhibits mass (right upper quadrant mass measuring 10cm by 10cm- nontender).    BP 143/90  Pulse 92  Temp(Src) 97.3 F (36.3 C) (Oral)  Wt 157 lb (71.215 kg)  BMI 29.19 kg/m2       Assessment & Plan:  1. Right upper quadrant abdominal mass Will wait on CT results before doing anything - CT Abd Limited W/Cm; Future  Mary-Margaret Daphine Deutscher, FNP

## 2013-04-11 NOTE — Telephone Encounter (Signed)
rx ready for pickup 

## 2013-04-11 NOTE — Patient Instructions (Signed)

## 2013-04-20 ENCOUNTER — Ambulatory Visit (INDEPENDENT_AMBULATORY_CARE_PROVIDER_SITE_OTHER): Payer: Medicare Other | Admitting: Nurse Practitioner

## 2013-04-20 ENCOUNTER — Other Ambulatory Visit: Payer: Self-pay

## 2013-04-20 ENCOUNTER — Encounter: Payer: Self-pay | Admitting: Nurse Practitioner

## 2013-04-20 VITALS — BP 117/76 | HR 110 | Temp 98.1°F | Ht 61.0 in | Wt 159.0 lb

## 2013-04-20 DIAGNOSIS — C499 Malignant neoplasm of connective and soft tissue, unspecified: Secondary | ICD-10-CM

## 2013-04-20 DIAGNOSIS — F411 Generalized anxiety disorder: Secondary | ICD-10-CM

## 2013-04-20 DIAGNOSIS — G4700 Insomnia, unspecified: Secondary | ICD-10-CM | POA: Insufficient documentation

## 2013-04-20 DIAGNOSIS — F4310 Post-traumatic stress disorder, unspecified: Secondary | ICD-10-CM | POA: Insufficient documentation

## 2013-04-20 DIAGNOSIS — G894 Chronic pain syndrome: Secondary | ICD-10-CM

## 2013-04-20 DIAGNOSIS — M069 Rheumatoid arthritis, unspecified: Secondary | ICD-10-CM | POA: Insufficient documentation

## 2013-04-20 DIAGNOSIS — IMO0002 Reserved for concepts with insufficient information to code with codable children: Secondary | ICD-10-CM

## 2013-04-20 DIAGNOSIS — R32 Unspecified urinary incontinence: Secondary | ICD-10-CM | POA: Insufficient documentation

## 2013-04-20 DIAGNOSIS — R1901 Right upper quadrant abdominal swelling, mass and lump: Secondary | ICD-10-CM

## 2013-04-20 MED ORDER — OXYCODONE HCL 5 MG PO TABS: 5.0000 mg | ORAL_TABLET | Freq: Four times a day (QID) | ORAL | Status: DC | PRN

## 2013-04-20 NOTE — Progress Notes (Signed)
  Subjective:    Patient ID: Jenna Zamora, female    DOB: 1971/02/17, 42 y.o.   MRN: [id number]  HPI Patient here today for follow up - she has fibrosarcoma and may currently have a reoccurrence- SHe use to be on fentanyl patch and stooped using she is now only on ox[facility]one [facility] for several days and now has diarrhea and usually has constipation.    Review of Systems  Constitutional: Negative.   HENT: Negative.   Eyes: Negative.   Respiratory: Negative.   Cardiovascular: Negative.   Gastrointestinal: Negative.   All other systems reviewed and are negative.       Objective:   Physical Exam  Constitutional: She appears well-developed and well-nourished.  Cardiovascular: Normal rate and normal heart sounds.   Pulmonary/Chest: Effort normal and breath sounds normal.  Abdominal: Soft. Bowel sounds are normal.    BP 117/76  Pulse 110  Temp(Src) 98.1 F (36.7 C) (Oral)  Ht 5\' 1"  (1.549 m)  Wt 159 lb (72.122 kg)  BMI 30.06 kg/m2       Assessment & Plan:   1. Herniated vertebral disc   2. DDD (degenerative disc disease)   3. Rheumatoid arthritis of hand   4. PTSD (post-traumatic stress disorder)   5. Fibrosarcoma   6. Insomnia   7. GAD (generalized anxiety disorder)   8. Chronic pain syndrome   9. Incontinence    No orders of the defined types were placed in this encounter.   Meds ordered this encounter  Medications  . oxyCODONE (OXY IR/ROXICODONE) 5 MG immediate release tablet    Sig: Take 1 tablet (5 mg total) by mouth every 6 (six) hours as needed. Breakthrough pain.    Dispense:  60 tablet    Refill:  0    Order Specific Question:  Supervising Provider    Answer:  Enid Skeens [[id number]]  to make appointment with clinical pharmacist for pain management Continue all meds Follow up in 3 months  Jenna Daphine Deutscher, FNP

## 2013-04-20 NOTE — Patient Instructions (Signed)
Pain Medicine Instructions  You have been given a prescription for pain medicines. These medicines may affect your ability to think clearly. They may also affect your ability to perform physical activities. Take these medicines only as needed for pain. You do not need to take them if you are not having pain, unless directed by your caregiver. You can take less than the prescribed dose if you find a smaller amount of medicine controls the pain. It may not be possible to make all of your pain go away, but you should be comfortable enough to move, breathe, and take care of yourself.  After you start taking pain medicines, while taking the medicines, and for 8 hours after stopping the medicines:   Do not drive.   Do not operate machinery.   Do not operate power tools.   Do not sign legal documents.   Do not supervise children by yourself.   Do not participate in activities that require climbing or being in high places.   Do not enter a body of water (lake, river, ocean, spa, swimming pool) without an adult nearby who can help you.  You may have been prescribed a pain medicine that contains acetaminophen (paracetamol). If so, take only the amount directed by your caregiver. Do not take any other acetaminophen while taking this medicine. An overdose of acetaminophen can result in severe liver damage. If you are taking other medicines, check the active ingredients for acetaminophen. Acetaminophen is found in hundreds of over-the-counter and prescription medicines. These include cold relief products, menstrual cramp relief medicines, fever-reducing medicines, acid indigestion relief products, and pain relief products.  HOME CARE INSTRUCTIONS    Do not drink alcohol, take sleeping pills, or take other medicines until at least 8 hours after your last dose of pain medicine, or as directed by your caregiver.   Use a bulk stool softener if you become constipated from your pain medicines. Increasing your intake of fruits  and vegetables will also help.   Write down the times when you take your medicines. Look at the times before taking your next dose of medicine. It is easy to become confused while on pain medicines. Recording the times helps you to avoid an overdose.  SEEK MEDICAL CARE IF:   Your medicine is not helping the pain go away.   You vomit or have diarrhea shortly after taking the medicine.   You develop new pain in areas that did not hurt before.  SEEK IMMEDIATE MEDICAL CARE IF:   You feel dizzy or faint.   You feel there are other problems that might be caused by your medicine.  MAKE SURE YOU:    Understand these instructions.   Will watch your condition.   Will get help right away if you are not doing well or get worse.  Document Released: 10/20/2000 Document Revised: 10/06/2011 Document Reviewed: 06/28/2010  [employer] Patient Information 2014 [employer]

## 2013-04-26 ENCOUNTER — Ambulatory Visit (HOSPITAL_COMMUNITY)
Admission: RE | Admit: 2013-04-26 | Discharge: 2013-04-26 | Disposition: A | Discharge status: Home / Self Care | Payer: Medicare Other | Source: Ambulatory Visit | Attending: Nurse Practitioner | Admitting: Nurse Practitioner

## 2013-04-26 DIAGNOSIS — R1901 Right upper quadrant abdominal swelling, mass and lump: Secondary | ICD-10-CM | POA: Insufficient documentation

## 2013-04-26 DIAGNOSIS — R911 Solitary pulmonary nodule: Secondary | ICD-10-CM | POA: Insufficient documentation

## 2013-04-26 MED ORDER — IOHEXOL 300 MG/ML  SOLN
100.0000 mL | Freq: Once | INTRAMUSCULAR | Status: AC | PRN
  Administered 2013-04-26: 100 mL via INTRAVENOUS

## 2013-05-02 ENCOUNTER — Telehealth: Payer: Self-pay | Admitting: Nurse Practitioner

## 2013-05-03 ENCOUNTER — Other Ambulatory Visit: Payer: Self-pay | Admitting: Nurse Practitioner

## 2013-05-03 ENCOUNTER — Telehealth: Payer: Self-pay | Admitting: Nurse Practitioner

## 2013-05-03 MED ORDER — ALPRAZOLAM 1 MG PO TABS: 1.0000 mg | ORAL_TABLET | Freq: Three times a day (TID) | ORAL | Status: DC

## 2013-05-03 NOTE — Telephone Encounter (Signed)
Please call in xanax rx with 0 refills-- Xanax 1mg  1 poTID #90- 0 refills--- Will not rx more than TID!

## 2013-05-03 NOTE — Telephone Encounter (Signed)
Can't rx nausea meds- use dramamine OTC

## 2013-05-03 NOTE — Telephone Encounter (Signed)
Done 04/11/13, but she phone note she says she takes 4 a day

## 2013-05-04 NOTE — Telephone Encounter (Signed)
Called in.

## 2013-05-05 ENCOUNTER — Telehealth: Payer: Self-pay | Admitting: Nurse Practitioner

## 2013-05-05 NOTE — Telephone Encounter (Signed)
We will only do TID because that is what is in chart- Delsym OTC for cough

## 2013-05-05 NOTE — Telephone Encounter (Signed)
Left detailed message on patients voicemail that MMM will only do TID. Also let her know to buy some OTC Delsym

## 2013-05-05 NOTE — Telephone Encounter (Signed)
Please advise 

## 2013-05-06 ENCOUNTER — Telehealth: Payer: Self-pay | Admitting: Nurse Practitioner

## 2013-05-06 NOTE — Telephone Encounter (Signed)
Thank you :)

## 2013-05-06 NOTE — Telephone Encounter (Signed)
Pharmacy in [place] called about rx for xanax. I had spoke with them yesterday and told them to only fill for TID and not QID. Pharmacist called back today and said that last xanax rx was filled on 9-15 and that it was too early and also there was a note in their system stating that all xanax was stopped. Advised pharmacy to NOT fill rx and if patient had any questions to contact office. FYI

## 2013-05-12 ENCOUNTER — Telehealth: Payer: Self-pay | Admitting: Nurse Practitioner

## 2013-05-12 ENCOUNTER — Telehealth: Payer: Self-pay | Admitting: Pharmacist

## 2013-05-12 DIAGNOSIS — G894 Chronic pain syndrome: Secondary | ICD-10-CM

## 2013-05-12 NOTE — Telephone Encounter (Signed)
Patient said she is in to much pain she needs it written out and put up front for sister to pick up

## 2013-05-12 NOTE — Telephone Encounter (Signed)
We can not ordr pain meds in [place]- has to pick up rx here- meds were rx as requested at appointment

## 2013-05-12 NOTE — Telephone Encounter (Signed)
Going to have to get from cancer center

## 2013-05-12 NOTE — Telephone Encounter (Signed)
We are not doing her pain meds correct?

## 2013-05-13 NOTE — Telephone Encounter (Signed)
Please send referral to a pain clinic someone through [facility]

## 2013-06-01 ENCOUNTER — Other Ambulatory Visit: Payer: Self-pay

## 2013-06-01 DIAGNOSIS — G894 Chronic pain syndrome: Secondary | ICD-10-CM

## 2013-06-08 ENCOUNTER — Ambulatory Visit (INDEPENDENT_AMBULATORY_CARE_PROVIDER_SITE_OTHER): Payer: Medicare Other | Admitting: Nurse Practitioner

## 2013-06-08 ENCOUNTER — Encounter: Payer: Self-pay | Admitting: Nurse Practitioner

## 2013-06-08 VITALS — BP 128/85 | HR 116 | Temp 99.1°F | Ht 61.0 in | Wt 150.0 lb

## 2013-06-08 DIAGNOSIS — J069 Acute upper respiratory infection, unspecified: Secondary | ICD-10-CM

## 2013-06-08 DIAGNOSIS — R1901 Right upper quadrant abdominal swelling, mass and lump: Secondary | ICD-10-CM

## 2013-06-08 MED ORDER — ALPRAZOLAM 1 MG PO TABS: 1.0000 mg | ORAL_TABLET | Freq: Three times a day (TID) | ORAL | Status: DC

## 2013-06-08 MED ORDER — CYCLOBENZAPRINE HCL 10 MG PO TABS: 10.0000 mg | ORAL_TABLET | Freq: Two times a day (BID) | ORAL | Status: DC | PRN

## 2013-06-08 MED ORDER — TEMAZEPAM 30 MG PO CAPS: 30.0000 mg | ORAL_CAPSULE | Freq: Every evening | ORAL | Status: DC | PRN

## 2013-06-08 NOTE — Progress Notes (Signed)
Subjective:    Patient ID: Jenna Zamora, female    DOB: [birth year], 42 y.o.   MRN: [id number]  Cough This is a new problem. The current episode started more than 1 month ago. The problem has been gradually improving. The problem occurs every few minutes. The cough is productive of purulent sputum. Associated symptoms include chills, a fever, headaches and nasal congestion. Risk factors for lung disease include smoking/tobacco exposure. Treatments tried: NSAID. The treatment provided mild relief. Her past medical history is significant for bronchitis. There is no history of COPD or emphysema.   Pt states she had a CT done Sept 30th that showed a "marble size" mass in her RUQ. Pt states she has been out of town with her husband, but since it has "gotten bigger". Pt states she gets cramps with movements or twisting her waist. Has hx of fibrosacoma of abdominal wall. Abdominal CT on 04/20/13- was negative - no abdominal wall masses.   Review of Systems  Constitutional: Positive for fever and chills.  Respiratory: Positive for cough.   Neurological: Positive for headaches.  All other systems reviewed and are negative.       Objective:   Physical Exam  Vitals reviewed. Constitutional: She is oriented to person, place, and time. She appears well-developed and well-nourished.  HENT:  Head: Normocephalic.  Mouth/Throat: Oropharynx is clear and moist.  Eyes: Pupils are equal, round, and reactive to light.  Neck: Normal range of motion. Neck supple.  Cardiovascular: Normal rate, regular rhythm, normal heart sounds and intact distal pulses.   Pulmonary/Chest: Effort normal and breath sounds normal.  Abdominal: Bowel sounds are normal. She exhibits mass. There is tenderness.  Musculoskeletal: Normal range of motion. She exhibits no edema.  Neurological: She is alert and oriented to person, place, and time.  Skin: Skin is warm and dry.  Psychiatric: She has a normal mood and affect. Her behavior  is normal. Judgment and thought content normal.    BP 128/85  Pulse 116  Temp(Src) 99.1 F (37.3 C) (Oral)  Ht 5\' 1"  (1.549 m)  Wt 150 lb (68.04 kg)  BMI 28.36 kg/m2       Assessment & Plan:  1. Right upper quadrant abdominal mass  - CT Abdomen Pelvis W Contrast; Future  2. Upper respiratory infection 1. Take meds as prescribed 2. Use a cool mist humidifier especially during the winter months and when heat has  been humid. 3. Use saline nose sprays frequently 4. Saline irrigations of the nose can be very helpful if done frequently.  * 4X daily for 1 week*  * Use of a nettie pot can be helpful with this. Follow directions with this* 5. Drink plenty of fluids 6. Keep thermostat turn down low 7.For any cough or congestion  Use plain Mucinex- regular strength or max strength is fine   * Children- consult with Pharmacist for dosing 8. For fever or aces or pains- take tylenol or ibuprofen appropriate for age and weight.  * for fevers greater than 101 orally you may alternate ibuprofen and tylenol every  3 hours.     Meds ordered this encounter  Medications  . cyclobenzaprine (FLEXERIL) 10 MG tablet    Sig: Take 1 tablet (10 mg total) by mouth 2 (two) times daily as needed for muscle spasms.    Dispense:  30 tablet    Refill:  0    Order Specific Question:  Supervising Provider    Answer:  Eber Hong D [[id number]]  .  ALPRAZolam (XANAX) 1 MG tablet    Sig: Take 1 tablet (1 mg total) by mouth 3 (three) times daily.    Dispense:  90 tablet    Refill:  0    Order Specific Question:  Supervising Provider    Answer:  Ernestina Penna [[id number]]  . temazepam (RESTORIL) 30 MG capsule    Sig: Take 1 capsule (30 mg total) by mouth at bedtime as needed for sleep.    Dispense:  30 capsule    Refill:  1    Order Specific Question:  Supervising Provider    Answer:  Enid Skeens [[id number]]      Mary-Margaret Daphine Deutscher, FNP

## 2013-06-08 NOTE — Patient Instructions (Signed)

## 2013-06-15 ENCOUNTER — Ambulatory Visit (HOSPITAL_COMMUNITY): Payer: Medicare Other

## 2013-07-04 ENCOUNTER — Ambulatory Visit (HOSPITAL_COMMUNITY)
Admission: RE | Admit: 2013-07-04 | Discharge: 2013-07-04 | Disposition: A | Discharge status: Home / Self Care | Payer: Medicare Other | Source: Ambulatory Visit | Attending: Nurse Practitioner | Admitting: Nurse Practitioner

## 2013-07-04 DIAGNOSIS — R1901 Right upper quadrant abdominal swelling, mass and lump: Secondary | ICD-10-CM | POA: Insufficient documentation

## 2013-07-04 DIAGNOSIS — R1011 Right upper quadrant pain: Secondary | ICD-10-CM | POA: Insufficient documentation

## 2013-07-04 DIAGNOSIS — R918 Other nonspecific abnormal finding of lung field: Secondary | ICD-10-CM | POA: Insufficient documentation

## 2013-07-04 MED ORDER — IOHEXOL 300 MG/ML  SOLN
100.0000 mL | Freq: Once | INTRAMUSCULAR | Status: AC | PRN
  Administered 2013-07-04: 100 mL via INTRAVENOUS

## 2013-07-05 ENCOUNTER — Other Ambulatory Visit: Payer: Self-pay | Admitting: Nurse Practitioner

## 2013-07-08 ENCOUNTER — Telehealth: Payer: Self-pay | Admitting: Nurse Practitioner

## 2013-07-12 MED ORDER — ALPRAZOLAM 1 MG PO TABS: 1.0000 mg | ORAL_TABLET | Freq: Three times a day (TID) | ORAL | Status: DC

## 2013-07-12 NOTE — Telephone Encounter (Signed)
Please call in xanax 1mg  1 po TID #90 0 refills CT of abd and pelvis was negative for mass.

## 2013-07-12 NOTE — Telephone Encounter (Signed)
What is next for this growth that she has. It is taking her breathe away. rx called into pharmacy

## 2013-07-12 NOTE — Telephone Encounter (Signed)
i guess will need to see a surgeon- where would like to go?

## 2013-07-19 NOTE — Telephone Encounter (Signed)
No we need to do referral

## 2013-07-19 NOTE — Telephone Encounter (Signed)
Patient seen general surgeon and they said that she need to see cardiac thorasic surgeon

## 2013-07-19 NOTE — Telephone Encounter (Signed)
Did they do referral?

## 2013-08-10 ENCOUNTER — Other Ambulatory Visit: Payer: Self-pay | Admitting: Nurse Practitioner

## 2013-08-12 NOTE — Telephone Encounter (Signed)
Last filled 07/12/13, last seen 06/08/13. Pt uses [facility] [phone]

## 2013-08-12 NOTE — Telephone Encounter (Signed)
Please call in xanax rx 

## 2013-08-12 NOTE — Telephone Encounter (Signed)
rx called into pharmacy

## 2013-09-06 ENCOUNTER — Other Ambulatory Visit: Payer: Self-pay | Admitting: Nurse Practitioner

## 2013-09-07 NOTE — Telephone Encounter (Signed)
Last visit 11-12. Please advise

## 2013-09-07 NOTE — Telephone Encounter (Signed)
NTBS before can get reill- hasn't been seen in awhile

## 2013-09-08 ENCOUNTER — Telehealth: Payer: Self-pay | Admitting: Nurse Practitioner

## 2013-09-08 ENCOUNTER — Telehealth: Payer: Self-pay | Admitting: *Deleted

## 2013-09-08 NOTE — Telephone Encounter (Signed)
Spoke with patient's husband. He relayed that she is not suicidal or homicidal. She has been out of her antidepressant for several months and hasn't been sleeping well.  She needs refills on multiple medications including temazepam.  Appt scheduled for tomorrow. Advised that since she has been on multiple medications and hasn't found them helpful that an appointment with a psychiatrist would be in her best interest. She has medicare insurance with no supplement or medicaid.  We can provide a list of local psychiatrists to patient at appt tomorrow.

## 2013-09-08 NOTE — Telephone Encounter (Signed)
Aware,NTBS. No refill.

## 2013-09-09 ENCOUNTER — Ambulatory Visit (INDEPENDENT_AMBULATORY_CARE_PROVIDER_SITE_OTHER): Payer: Medicare Other | Admitting: Family Medicine

## 2013-09-09 ENCOUNTER — Encounter: Payer: Self-pay | Admitting: Family Medicine

## 2013-09-09 VITALS — BP 140/82 | HR 105 | Temp 99.1°F | Ht 61.0 in | Wt 141.0 lb

## 2013-09-09 DIAGNOSIS — F32A Depression, unspecified: Secondary | ICD-10-CM

## 2013-09-09 DIAGNOSIS — F329 Major depressive disorder, single episode, unspecified: Secondary | ICD-10-CM

## 2013-09-09 DIAGNOSIS — F3289 Other specified depressive episodes: Secondary | ICD-10-CM

## 2013-09-09 DIAGNOSIS — M62838 Other muscle spasm: Secondary | ICD-10-CM

## 2013-09-09 MED ORDER — ALPRAZOLAM 1 MG PO TABS: 1.0000 mg | ORAL_TABLET | Freq: Two times a day (BID) | ORAL | Status: DC | PRN

## 2013-09-09 MED ORDER — CYCLOBENZAPRINE HCL 10 MG PO TABS: 10.0000 mg | ORAL_TABLET | Freq: Three times a day (TID) | ORAL | Status: DC | PRN

## 2013-09-09 MED ORDER — CITALOPRAM HYDROBROMIDE 40 MG PO TABS: 40.0000 mg | ORAL_TABLET | Freq: Every day | ORAL | Status: DC

## 2013-09-09 MED ORDER — TEMAZEPAM 30 MG PO CAPS: 30.0000 mg | ORAL_CAPSULE | Freq: Every evening | ORAL | Status: DC | PRN

## 2013-09-09 NOTE — Progress Notes (Signed)
   Subjective:    Patient ID: Jenna Zamora, female    DOB: 1970-08-18, 43 y.o.   MRN: [id number]  HPI This 43 y.o. female presents for evaluation of depression and bipolar and PTSD.  She states She has Chronic Pain from DDD.  She states she is having back pain and muscle spasms. She states she has pretty much been laying in bed and has been severly depressed.  She  Was taking abilify and she quit this.  She states she quit taking fentanyl in September.  She came Off her celexa, her phenterimine, and oxydocone.  She has menopause.  She denies suicide or homicidal ideation.   Review of Systems C/o muscle spasms, anxiety, insomnia, nightmares, and depression No chest pain, SOB, HA, dizziness, vision change, N/V, diarrhea, constipation, dysuria, urinary urgency or frequency or rash.     Objective:   Physical Exam  Vital signs noted  Well developed well nourished female.  HEENT - Head atraumatic Normocephalic                Eyes - PERRLA, Conjuctiva - clear Sclera- Clear EOMI                Ears - EAC's Wnl TM's Wnl Gross Hearing WNL                Nose - Nares patent                 Throat - oropharanx wnl Respiratory - Lungs CTA bilateral Cardiac - RRR S1 and S2 without murmur GI - Abdomen soft Nontender and bowel sounds active x 4 Extremities - No edema. Neuro - Grossly intact. MS - Abdominal muscle spasm     Assessment & Plan:  Depression - Discussed with patient she should see psychiatry since she has complicated Hx of bipolar, PTSD, and insomnia.  She states she was given a list of providers and she  Hasn't found one yet.  I recommend she go to the ED at [facility] and get seen ASAP. I have given her #30 xanax and #30 temazepam until she is seen.  Celexa 40mg  rx'd  Spasm of abdominal muscles of right side - Flexeril 10mg  one po tid prn #30  Jenna Penner FNP

## 2013-09-30 ENCOUNTER — Ambulatory Visit: Payer: Medicare Other | Admitting: Nurse Practitioner

## 2014-02-04 ENCOUNTER — Emergency Department (HOSPITAL_COMMUNITY)
Admission: EM | Admit: 2014-02-04 | Discharge: 2014-02-05 | Disposition: A | Discharge status: Home / Self Care | Payer: Medicare Other | Attending: Emergency Medicine | Admitting: Emergency Medicine

## 2014-02-04 ENCOUNTER — Encounter (HOSPITAL_COMMUNITY): Payer: Self-pay | Admitting: Emergency Medicine

## 2014-02-04 DIAGNOSIS — Z8719 Personal history of other diseases of the digestive system: Secondary | ICD-10-CM | POA: Insufficient documentation

## 2014-02-04 DIAGNOSIS — Z8739 Personal history of other diseases of the musculoskeletal system and connective tissue: Secondary | ICD-10-CM | POA: Insufficient documentation

## 2014-02-04 DIAGNOSIS — F410 Panic disorder [episodic paroxysmal anxiety] without agoraphobia: Secondary | ICD-10-CM | POA: Insufficient documentation

## 2014-02-04 DIAGNOSIS — F17200 Nicotine dependence, unspecified, uncomplicated: Secondary | ICD-10-CM | POA: Insufficient documentation

## 2014-02-04 DIAGNOSIS — F319 Bipolar disorder, unspecified: Secondary | ICD-10-CM | POA: Insufficient documentation

## 2014-02-04 DIAGNOSIS — G8929 Other chronic pain: Secondary | ICD-10-CM | POA: Insufficient documentation

## 2014-02-04 DIAGNOSIS — R079 Chest pain, unspecified: Secondary | ICD-10-CM | POA: Insufficient documentation

## 2014-02-04 DIAGNOSIS — Z859 Personal history of malignant neoplasm, unspecified: Secondary | ICD-10-CM | POA: Insufficient documentation

## 2014-02-04 DIAGNOSIS — Z79899 Other long term (current) drug therapy: Secondary | ICD-10-CM | POA: Insufficient documentation

## 2014-02-04 NOTE — ED Notes (Signed)
Pt requesting pain med at this time 

## 2014-02-04 NOTE — ED Notes (Signed)
Patient c/o chest pain onset last pm epigrastric with radiation to substernal chest and readiates under both breast. C/o swelling to both ankles bilateral pedal pulses present. States upon arrival to hospital pain was in her jaw.

## 2014-02-05 ENCOUNTER — Emergency Department (HOSPITAL_COMMUNITY): Payer: Medicare Other

## 2014-02-05 LAB — CBC WITH DIFFERENTIAL/PLATELET
Basophils Absolute: 0 10*3/uL (ref 0.0–0.1)
Basophils Relative: 1 % (ref 0–1)
Eosinophils Absolute: 0.2 10*3/uL (ref 0.0–0.7)
Eosinophils Relative: 3 % (ref 0–5)
HEMATOCRIT: 37.9 % (ref 36.0–46.0)
Hemoglobin: 12.2 g/dL (ref 12.0–15.0)
LYMPHS PCT: 31 % (ref 12–46)
Lymphs Abs: 2 10*3/uL (ref 0.7–4.0)
MCH: 31 pg (ref 26.0–34.0)
MCHC: 32.2 g/dL (ref 30.0–36.0)
MCV: 96.4 fL (ref 78.0–100.0)
MONO ABS: 0.3 10*3/uL (ref 0.1–1.0)
Monocytes Relative: 4 % (ref 3–12)
Neutro Abs: 3.9 10*3/uL (ref 1.7–7.7)
Neutrophils Relative %: 61 % (ref 43–77)
Platelets: 177 10*3/uL (ref 150–400)
RBC: 3.93 MIL/uL (ref 3.87–5.11)
RDW: 12.7 % (ref 11.5–15.5)
WBC: 6.4 10*3/uL (ref 4.0–10.5)

## 2014-02-05 LAB — BASIC METABOLIC PANEL
Anion gap: 15 (ref 5–15)
BUN: 8 mg/dL (ref 6–23)
CHLORIDE: 104 meq/L (ref 96–112)
CO2: 24 meq/L (ref 19–32)
CREATININE: 0.59 mg/dL (ref 0.50–1.10)
Calcium: 9.1 mg/dL (ref 8.4–10.5)
GFR calc Af Amer: >90 mL/min (ref >90)
GFR calc non Af Amer: >90 mL/min (ref >90)
Glucose, Bld: 93 mg/dL (ref 70–99)
POTASSIUM: 3.8 meq/L (ref 3.7–5.3)
SODIUM: 143 meq/L (ref 137–147)

## 2014-02-05 LAB — I-STAT TROPONIN, ED
Troponin i, poc: 0 ng/mL (ref 0.00–0.08)
Troponin i, poc: 0 ng/mL (ref 0.00–0.08)

## 2014-02-05 MED ORDER — ASPIRIN 81 MG PO CHEW: 324.0000 mg | CHEWABLE_TABLET | Freq: Once | ORAL | Status: DC

## 2014-02-05 MED ORDER — OXYCODONE-ACETAMINOPHEN 5-325 MG PO TABS
2.0000 | ORAL_TABLET | Freq: Once | ORAL | Status: AC
  Administered 2014-02-05: 2 via ORAL
  Filled 2014-02-05: qty 2

## 2014-02-05 NOTE — Discharge Instructions (Signed)

## 2014-02-05 NOTE — ED Notes (Signed)
Pt st's pain has improved some at this time.

## 2014-02-05 NOTE — ED Provider Notes (Signed)
CSN: [id number]     Arrival date & time 02/04/14  2158 History   First MD Initiated Contact with Patient 02/05/14 0114     Chief Complaint  Patient presents with  . Chest Pain      Patient is a 43 y.o. female presenting with chest pain. The history is provided by the patient.  Chest Pain Pain location:  L chest Pain quality: pressure   Pain radiates to:  Does not radiate Pain severity:  Moderate Onset quality:  Gradual Duration:  24 hours Timing:  Constant Progression:  Unchanged Chronicity:  New Relieved by:  Nothing Exacerbated by: palpation. Associated symptoms: dizziness and shortness of breath   Associated symptoms: no fever and no syncope   PT reports gradual onset of constant CP for 24 hours.  Mostly located in left chest On arrival, she had pain in her left jaw but pain did not radiate from chest She report SOB and dizziness during CP episode  She also reports recent bilateral LE edema that is improving. She reports recent significant walking which may have caused this  She denies h/o CAD  fam hx - +CAD    Past Medical History  Diagnosis Date  . Cancer   . Arthritis   . DDD (degenerative disc disease)   . Bipolar 1 disorder   . Panic attacks   . Chronic back pain   . Anemia   . Blood transfusion without reported diagnosis   . Emphysema of lung   . GERD (gastroesophageal reflux disease)   . Hyperlipidemia   . PTSD (post-traumatic stress disorder)    Past Surgical History  Procedure Laterality Date  . Abdominal surgery    . Tubal ligation    . Back surgery    . Hip surgery     Family History  Problem Relation Age of Onset  . Cancer Mother     ovarian  . Heart disease Mother   . Cancer Maternal Aunt     breast  . Cancer Maternal Grandmother     ovarian   History  Substance Use Topics  . Smoking status: Current Every Day Smoker -- 1.00 packs/day    Types: Cigarettes    Start date: 07/28/2005  . Smokeless tobacco: Never Used  . Alcohol Use:  Yes   OB History   Grav Para Term Preterm Abortions TAB SAB Ect Mult Living                 Review of Systems  Constitutional: Negative for fever.  Respiratory: Positive for shortness of breath.   Cardiovascular: Positive for chest pain. Negative for syncope.  Neurological: Positive for dizziness.  All other systems reviewed and are negative.     Allergies  Codeine  Home Medications   Prior to Admission medications   Medication Sig Start Date End Date Taking? Authorizing Provider  ALPRAZolam Duanne Moron) 1 MG tablet Take 1 tablet (1 mg total) by mouth 2 (two) times daily as needed for anxiety. 09/09/13  Yes Lysbeth Penner, FNP  Doxylamine Succinate, Sleep, (SLEEP AID PO) Take 2 tablets by mouth at bedtime.   Yes Historical Provider, MD  Melatonin 3 MG TABS Take 6 mg by mouth at bedtime. Dissolving tablets   Yes Historical Provider, MD   BP 130/82  Pulse 84  Temp(Src) 98 F (36.7 C) (Oral)  Resp 20  Ht 5\' 1"  (1.549 m)  Wt 138 lb (62.596 kg)  BMI 26.09 kg/m2  SpO2 100% Physical Exam CONSTITUTIONAL: Well developed/well  nourished HEAD: Normocephalic/atraumatic EYES: EOMI/PERRL ENMT: Mucous membranes moist, no bruising or edema to face NECK: supple no meningeal signs SPINE:entire spine nontender CV: S1/S2 noted, no murmurs/rubs/gallops noted Chest - tenderness to palpation of left chest that reproduces her pain LUNGS: Lungs are clear to auscultation bilaterally, no apparent distress ABDOMEN: soft, nontender, no rebound or guarding GU:no cva tenderness NEURO: Pt is awake/alert, moves all extremitiesx4 EXTREMITIES: pulses normal, full ROM Minimal symmetric edema to bilateral LE.  No calf tenderness SKIN: warm, color normal PSYCH: no abnormalities of mood noted  ED Course  Procedures  HEART Score - 2(family history/smoker, nonspecific EKG) History is atypical (pain for 24 hours, reproducible on exam) Will check 3 hours troponin/ekg Pt well appearing Already took ASA  today prior to arrival I doubt PE/dissection at this time  3:42 AM Pt stable, eating in the ED Repeat EKG/troponi negative Stable for d/c home   Labs Review Labs Reviewed  [place], ED    Imaging Review Dg Chest 2 View  02/05/2014   CLINICAL DATA:  Chest pain with bilateral leg pain edema.  EXAM: CHEST  2 VIEW  COMPARISON:  06/05/2012  FINDINGS: The heart size and mediastinal contours are within normal limits. Both lungs are clear. The visualized skeletal structures are unremarkable.  IMPRESSION: No active cardiopulmonary disease.   Electronically Signed   By: Lucienne Capers M.D.   On: 02/05/2014 00:56     EKG Interpretation   Date/Time:  Saturday February 04 2014 22:02:35 EDT Ventricular Rate:  85 PR Interval:  109 QRS Duration: 90 QT Interval:  389 QTC Calculation: 463 R Axis:   95 Text Interpretation:  Sinus rhythm Short PR interval Borderline right axis  deviation Borderline T wave abnormalities No previous ECGs available  Confirmed by Christy Gentles  MD, Elenore Rota ([id number]) on 02/04/2014 11:06:32 PM      MDM   Final diagnoses:  Chest pain, unspecified chest pain type    Nursing notes including past medical history and social history reviewed and considered in documentation xrays reviewed and considered Labs/vital reviewed and considered      Sharyon Cable, MD 02/05/14 [phone]

## 2014-02-05 NOTE — ED Notes (Signed)
Pt A&OX4, ambulatory at d/c with steady gait, NAD 

## 2014-02-05 NOTE — ED Notes (Signed)
Pt requesting coffee and pizza to eat.  Per Dr. Christy Gentles only something light at this time.  Pt made aware.  Husband remains at bedside.

## 2014-02-06 ENCOUNTER — Telehealth: Payer: Self-pay | Admitting: Nurse Practitioner

## 2014-02-07 ENCOUNTER — Emergency Department (HOSPITAL_COMMUNITY): Payer: Medicare Other

## 2014-02-07 ENCOUNTER — Encounter (HOSPITAL_COMMUNITY): Payer: Self-pay | Admitting: Emergency Medicine

## 2014-02-07 ENCOUNTER — Encounter: Payer: Self-pay | Admitting: Nurse Practitioner

## 2014-02-07 ENCOUNTER — Emergency Department (HOSPITAL_COMMUNITY)
Admission: EM | Admit: 2014-02-07 | Discharge: 2014-02-07 | Disposition: A | Discharge status: Home / Self Care | Payer: Medicare Other | Attending: Emergency Medicine | Admitting: Emergency Medicine

## 2014-02-07 ENCOUNTER — Ambulatory Visit (INDEPENDENT_AMBULATORY_CARE_PROVIDER_SITE_OTHER): Payer: Medicare Other | Admitting: Nurse Practitioner

## 2014-02-07 VITALS — BP 140/80 | HR 98 | Temp 98.7°F | Ht 61.0 in | Wt 149.8 lb

## 2014-02-07 DIAGNOSIS — Z79899 Other long term (current) drug therapy: Secondary | ICD-10-CM | POA: Insufficient documentation

## 2014-02-07 DIAGNOSIS — M5412 Radiculopathy, cervical region: Secondary | ICD-10-CM | POA: Insufficient documentation

## 2014-02-07 DIAGNOSIS — R079 Chest pain, unspecified: Secondary | ICD-10-CM

## 2014-02-07 DIAGNOSIS — Z8659 Personal history of other mental and behavioral disorders: Secondary | ICD-10-CM | POA: Insufficient documentation

## 2014-02-07 DIAGNOSIS — IMO0002 Reserved for concepts with insufficient information to code with codable children: Secondary | ICD-10-CM | POA: Insufficient documentation

## 2014-02-07 DIAGNOSIS — F410 Panic disorder [episodic paroxysmal anxiety] without agoraphobia: Secondary | ICD-10-CM

## 2014-02-07 DIAGNOSIS — J438 Other emphysema: Secondary | ICD-10-CM | POA: Insufficient documentation

## 2014-02-07 DIAGNOSIS — G8929 Other chronic pain: Secondary | ICD-10-CM | POA: Insufficient documentation

## 2014-02-07 DIAGNOSIS — Z859 Personal history of malignant neoplasm, unspecified: Secondary | ICD-10-CM | POA: Insufficient documentation

## 2014-02-07 DIAGNOSIS — G4700 Insomnia, unspecified: Secondary | ICD-10-CM

## 2014-02-07 DIAGNOSIS — Z8739 Personal history of other diseases of the musculoskeletal system and connective tissue: Secondary | ICD-10-CM | POA: Insufficient documentation

## 2014-02-07 DIAGNOSIS — Z862 Personal history of diseases of the blood and blood-forming organs and certain disorders involving the immune mechanism: Secondary | ICD-10-CM | POA: Insufficient documentation

## 2014-02-07 DIAGNOSIS — F17200 Nicotine dependence, unspecified, uncomplicated: Secondary | ICD-10-CM | POA: Insufficient documentation

## 2014-02-07 DIAGNOSIS — K219 Gastro-esophageal reflux disease without esophagitis: Secondary | ICD-10-CM | POA: Insufficient documentation

## 2014-02-07 LAB — BASIC METABOLIC PANEL
Anion gap: 12 (ref 5–15)
BUN: 6 mg/dL (ref 6–23)
CO2: 26 mEq/L (ref 19–32)
Calcium: 9.5 mg/dL (ref 8.4–10.5)
Chloride: 106 mEq/L (ref 96–112)
Creatinine, Ser: 0.64 mg/dL (ref 0.50–1.10)
GFR calc non Af Amer: >90 mL/min (ref >90)
Glucose, Bld: 94 mg/dL (ref 70–99)
POTASSIUM: 3.9 meq/L (ref 3.7–5.3)
SODIUM: 144 meq/L (ref 137–147)

## 2014-02-07 LAB — D-DIMER, QUANTITATIVE (NOT AT ARMC): D DIMER QUANT: 0.61 ug{FEU}/mL — AB (ref 0.00–0.48)

## 2014-02-07 LAB — TROPONIN I: Troponin I: <0.3 ng/mL (ref <0.30)

## 2014-02-07 MED ORDER — PREDNISONE 20 MG PO TABS: ORAL_TABLET | ORAL | Status: DC

## 2014-02-07 MED ORDER — HYDROCODONE-ACETAMINOPHEN 5-325 MG PO TABS
2.0000 | ORAL_TABLET | Freq: Once | ORAL | Status: AC
  Administered 2014-02-07: 2 via ORAL
  Filled 2014-02-07: qty 2

## 2014-02-07 MED ORDER — CYCLOBENZAPRINE HCL 7.5 MG PO TABS: 7.5000 mg | ORAL_TABLET | Freq: Three times a day (TID) | ORAL | Status: DC | PRN

## 2014-02-07 MED ORDER — IOHEXOL 350 MG/ML SOLN
100.0000 mL | Freq: Once | INTRAVENOUS | Status: AC | PRN
  Administered 2014-02-07: 100 mL via INTRAVENOUS

## 2014-02-07 MED ORDER — TEMAZEPAM 7.5 MG PO CAPS: 7.5000 mg | ORAL_CAPSULE | Freq: Every evening | ORAL | Status: DC | PRN

## 2014-02-07 MED ORDER — HYDROCODONE-ACETAMINOPHEN 5-325 MG PO TABS: 2.0000 | ORAL_TABLET | ORAL | Status: DC | PRN

## 2014-02-07 MED ORDER — ESCITALOPRAM OXALATE 10 MG PO TABS: 10.0000 mg | ORAL_TABLET | Freq: Every day | ORAL | Status: DC

## 2014-02-07 NOTE — Discharge Instructions (Signed)
Cervical Radiculopathy Cervical radiculopathy happens when a nerve in the neck is pinched or bruised by a slipped (herniated) disk or by arthritic changes in the bones of the cervical spine. This can occur due to an injury or as part of the normal aging process. Pressure on the cervical nerves can cause pain or numbness that runs from your neck all the way down into your arm and fingers. CAUSES  There are many possible causes, including:  Injury.  Muscle tightness in the neck from overuse.  Swollen, painful joints (arthritis).  Breakdown or degeneration in the bones and joints of the spine (spondylosis) due to aging.  Bone spurs that may develop near the cervical nerves. SYMPTOMS  Symptoms include pain, weakness, or numbness in the affected arm and hand. Pain can be severe or irritating. Symptoms may be worse when extending or turning the neck. DIAGNOSIS  Your caregiver will ask about your symptoms and do a physical exam. He or she may test your strength and reflexes. X-rays, CT scans, and MRI scans may be needed in cases of injury or if the symptoms do not go away after a period of time. Electromyography (EMG) or nerve conduction testing may be done to study how your nerves and muscles are working. TREATMENT  Your caregiver may recommend certain exercises to help relieve your symptoms. Cervical radiculopathy can, and often does, get better with time and treatment. If your problems continue, treatment options may include:  Wearing a soft collar for short periods of time.  Physical therapy to strengthen the neck muscles.  Medicines, such as nonsteroidal anti-inflammatory drugs (NSAIDs), oral corticosteroids, or spinal injections.  Surgery. Different types of surgery may be done depending on the cause of your problems. HOME CARE INSTRUCTIONS   Put ice on the affected area.  Put ice in a plastic bag.  Place a towel between your skin and the bag.  Leave the ice on for 15-20 minutes,  03-04 times a day or as directed by your caregiver.  If ice does not help, you can try using heat. Take a warm shower or bath, or use a hot water bottle as directed by your caregiver.  You may try a gentle neck and shoulder massage.  Use a flat pillow when you sleep.  Only take over-the-counter or prescription medicines for pain, discomfort, or fever as directed by your caregiver.  If physical therapy was prescribed, follow your caregiver's directions.  If a soft collar was prescribed, use it as directed. SEEK IMMEDIATE MEDICAL CARE IF:   Your pain gets much worse and cannot be controlled with medicines.  You have weakness or numbness in your hand, arm, face, or leg.  You have a high fever or a stiff, rigid neck.  You lose bowel or bladder control (incontinence).  You have trouble with walking, balance, or speaking. MAKE SURE YOU:   Understand these instructions.  Will watch your condition.  Will get help right away if you are not doing well or get worse. Document Released: 04/08/2001 Document Revised: 10/06/2011 Document Reviewed: 02/25/2011 [facility] Patient Information 2015 [place], [place]. This information is not intended to replace advice given to you by your health care provider. Make sure you discuss any questions you have with your health care provider.  Chest Pain (Nonspecific) It is often hard to give a specific diagnosis for the cause of chest pain. There is always a chance that your pain could be related to something serious, such as a heart attack or a blood clot  in the lungs. You need to follow up with your health care provider for further evaluation. CAUSES   Heartburn.  Pneumonia or bronchitis.  Anxiety or stress.  Inflammation around your heart (pericarditis) or lung (pleuritis or pleurisy).  A blood clot in the lung.  A collapsed lung (pneumothorax). It can develop suddenly on its own (spontaneous pneumothorax) or from trauma to the chest.  Shingles  infection (herpes zoster virus). The chest wall is composed of bones, muscles, and cartilage. Any of these can be the source of the pain.  The bones can be bruised by injury.  The muscles or cartilage can be strained by coughing or overwork.  The cartilage can be affected by inflammation and become sore (costochondritis). DIAGNOSIS  Lab tests or other studies may be needed to find the cause of your pain. Your health care provider may have you take a test called an ambulatory electrocardiogram (ECG). An ECG records your heartbeat patterns over a 24-hour period. You may also have other tests, such as:  Transthoracic echocardiogram (TTE). During echocardiography, sound waves are used to evaluate how blood flows through your heart.  Transesophageal echocardiogram (TEE).  Cardiac monitoring. This allows your health care provider to monitor your heart rate and rhythm in real time.  Holter monitor. This is a portable device that records your heartbeat and can help diagnose heart arrhythmias. It allows your health care provider to track your heart activity for several days, if needed.  Stress tests by exercise or by giving medicine that makes the heart beat faster. TREATMENT   Treatment depends on what may be causing your chest pain. Treatment may include:  Acid blockers for heartburn.  Anti-inflammatory medicine.  Pain medicine for inflammatory conditions.  Antibiotics if an infection is present.  You may be advised to change lifestyle habits. This includes stopping smoking and avoiding alcohol, caffeine, and chocolate.  You may be advised to keep your head raised (elevated) when sleeping. This reduces the chance of acid going backward from your stomach into your esophagus. Most of the time, nonspecific chest pain will improve within 2-3 days with rest and mild pain medicine.  HOME CARE INSTRUCTIONS   If antibiotics were prescribed, take them as directed. Finish them even if you start  to feel better.  For the next few days, avoid physical activities that bring on chest pain. Continue physical activities as directed.  Do not use any tobacco products, including cigarettes, chewing tobacco, or electronic cigarettes.  Avoid drinking alcohol.  Only take medicine as directed by your health care provider.  Follow your health care provider's suggestions for further testing if your chest pain does not go away.  Keep any follow-up appointments you made. If you do not go to an appointment, you could develop lasting (chronic) problems with pain. If there is any problem keeping an appointment, call to reschedule. SEEK MEDICAL CARE IF:   Your chest pain does not go away, even after treatment.  You have a rash with blisters on your chest.  You have a fever. SEEK IMMEDIATE MEDICAL CARE IF:   You have increased chest pain or pain that spreads to your arm, neck, jaw, back, or abdomen.  You have shortness of breath.  You have an increasing cough, or you cough up blood.  You have severe back or abdominal pain.  You feel nauseous or vomit.  You have severe weakness.  You faint.  You have chills. This is an emergency. Do not wait to see if the pain  will go away. Get medical help at once. Call your local emergency services (911 in U.[place] Do not drive yourself to the hospital. MAKE SURE YOU:   Understand these instructions.  Will watch your condition.  Will get help right away if you are not doing well or get worse. Document Released: 04/23/2005 Document Revised: 07/19/2013 Document Reviewed: 02/17/2008 [facility] Patient Information 2015 [place], [place]. This information is not intended to replace advice given to you by your health care provider. Make sure you discuss any questions you have with your health care provider.   Emergency Department Resource Guide 1) Find a Doctor and Pay Out of Pocket Although you won't have to find out who is covered by your insurance plan,  it is a good idea to ask around and get recommendations. You will then need to call the office and see if the doctor you have chosen will accept you as a new patient and what types of options they offer for patients who are self-pay. Some doctors offer discounts or will set up payment plans for their patients who do not have insurance, but you will need to ask so you aren't surprised when you get to your appointment.  2) Contact Your Local Health Department Not all health departments have doctors that can see patients for sick visits, but many do, so it is worth a call to see if yours does. If you don't know where your local health department is, you can check in your phone book. The CDC also has a tool to help you locate your state's health department, and many state websites also have listings of all of their local health departments.  3) Find a [facility] If your illness is not likely to be very severe or complicated, you may want to try a walk in clinic. These are popping up all over the country in pharmacies, drugstores, and shopping centers. They're usually staffed by nurse practitioners or physician assistants that have been trained to treat common illnesses and complaints. They're usually fairly quick and inexpensive. However, if you have serious medical issues or chronic medical problems, these are probably not your best option.  No Primary Care Doctor: - Call Health Connect at  [phone] - they can help you locate a primary care doctor that  accepts your insurance, provides certain services, etc. - Physician Referral Service- [phone]  Chronic Pain Problems: Organization         Address  Phone   Notes  [facility]  [phone] Patients need to be referred by their primary care doctor.   Medication Assistance: Organization         Address  Phone   Notes  [facility] [address] [place], [place] [address] [phone] --Must be a resident of [facility] -- Must have NO insurance coverage whatsoever (no Medicaid/ Medicare, etc.) -- The pt. MUST have a primary care doctor that directs their care regularly and follows them in the community   [facility]ist  [phone]   [employer]  [phone]    Agencies that provide inexpensive medical care: Organization         Address  Phone   Notes  [place]  [phone]   [facility]    [phone]   [facility] [place], [place] [address] [phone]   [place] [phone])  [place]    [phone]   [facility]    [phone]   [place] and [place] [address], [place] Phone:  [phone], Fax:  [phone] Hours of Operation:  9 am - 6 pm, M-F.  Also accepts Medicaid/Medicare and self-pay.  [facility] for [place], [address], [place] Phone: [phone], Fax: [phone]. Hours of Operation:  8:30 am - 5:30 pm, M-F.  Also accepts Medicaid and self-pay.  [facility] [place] [address], [place] Phone: [phone]   [place], [place], [place] [phone], Ext. [phone] Mondays & Thursdays: 7-9 AM.  First 15 patients are seen on a first come, first serve basis.    [facility] Providers:  Organization         Address  Phone   Notes  [facility] [address], [place] [phone] Also accepts self-pay patients.  [facility] [address], [place]  [phone]   [place] [place], [address], [place] [phone]   [facility] Family Medicine [address], [place] [phone]   Lucianne Lei [address], [place]   [phone] Only accepts [place] Access [place] patients after they have their name applied to their card.   Self-Pay (no insurance) in [facility]:  Organization         Address  Phone   Notes  Sickle Cell Patients, [facility] Internal Medicine [place] [phone]   [facility] [place] [phone]   [facility] [place]  [place], [place],  [phone]   [facility]/Dr. Osei-Bonsu  [address] [place] or [address], [place] [phone] Phone number for both [place] and [place] locations is the same.  Urgent Medical and [facility] [address], [place] [phone]   [facility] [address], [place] or [address] [phone]   [facility] [address] [place] [phone], phone; [phone], fax Sees patients 1st and 3rd Saturday of every month.  Must not qualify for public or private insurance (i.e. Medicaid, Medicare, [place] Health Choice, Veterans' Benefits)  Household income should be no more than 200% of the poverty level The clinic cannot treat you if you are pregnant or think you are pregnant  Sexually transmitted diseases are not treated at the clinic.    Dental Care: Organization         Address  Phone  Notes  [facility] Department of [facility] [phone] Accepts children up to age 91 who are enrolled in [place] or [place]; pregnant women with a Medicaid card; and children who have applied for Medicaid or [place] Health Choice, but were declined, whose parents can pay a reduced fee at time of service.  [facility] Department of [facility]  [address], [place] [phone] Accepts children up to age 60 who are enrolled in [place] or [place]; pregnant women with a Medicaid card; and children who have applied for Medicaid or  Health Choice,  but were declined, whose parents can pay a reduced fee at time of service.  [facility]  [place] [phone] Patients are seen by appointment only. Walk-ins are not accepted. [place] will see patients 67 years of age and older. Monday - Tuesday (8am-5pm) Most Wednesdays (8:30-5pm) $30 per visit, cash only  [facility] Adult Dental Access PROGRAM  [address], [facility] [phone] Patients are seen by appointment only. Walk-ins are not accepted. [place] will see patients 79 years of age and older. One Wednesday Evening (Monthly: Volunteer Based).  $30 per visit, cash only  [place]  [phone] for adults; Children under age 40, call Graduate Pediatric Dentistry at [phone]. Children aged 72-14, please call [phone] to request a pediatric application.  Dental services are provided in all areas of dental care including fillings, crowns and bridges, complete and partial dentures, implants, gum treatment, root canals, and extractions. Preventive care is also provided. Treatment is provided to both adults and children. Patients are selected via a lottery and there is often a waiting list.   [facility] [place] [address] [place]  [phone] [email or web address]   [facility] [address] [place], [place] [phone], Ext. [phone] Second and Fourth Thursday of each month, opens at 6:30 AM; Clinic ends at 9 AM.  Patients are seen on a first-come first-served basis, and a limited number are seen during each clinic.   [facility]  [address] Hillard Danker [place], [place] [phone]   Eligibility Requirements You must have lived in [place], [place], or [place] counties for at least the last three months.   You cannot be eligible for state or  federal sponsored [employer], including [employer], [place], or [employer].   You generally cannot be eligible for healthcare insurance through your employer.    How to apply: Eligibility screenings are held every Tuesday and Wednesday afternoon from 1:00 pm until 4:00 pm. You do not need an appointment for the interview!  [facility] [address] [place], [place]   [place]  [place] Department  [place]  [phone]    Behavioral Health Resources in the Community: Intensive Outpatient Programs Organization         Address  Phone  Notes  [place]. [address] [place], [place] [phone]   [facility] Outpatient [address] [place], [place]   ADS: Alcohol & Drug Svcs [address] [place], [place]   [place] [address],  [place], [place] or [phone]   Substance Abuse Resources Organization         Address  Phone  Notes  Alcohol and Drug Services  [phone]   [place]  [phone]   [facility] [place]   Chinita Pester  [phone]   Residential & Outpatient Substance Abuse Program  [phone]   Psychological Services Organization         Address  Phone  Notes  [facility] [place]  [place]  [phone]   [place] [address], [place] or [phone]    Mobile Crisis Teams Organization         Address  Phone  Notes  Therapeutic  Alternatives, Mobile Crisis Care Unit  [phone]   [facility]ive Psychotherapeutic Services  [address] [place], [place]   [facility] [address] [place] [phone]    Self-Help/Support Groups Organization         Address  Phone              Notes  [place]. of [place] - variety of support groups  [place] Call for more information  Narcotics Anonymous (NA), [facility] [address], [employer] [place]  2 meetings at this location   Special educational needs teacher         Address  Phone  Notes  [facility]idential [facility] [place],    [place]  [phone]   [facility]  [address] [place] [address], [place], [place]   [place], [place] [phone] Admissions: 8am-3pm M-F  Incentives Substance [place] [address]    [place], [place] [phone]   [facility] [address] [place], [place], [place]   [facility] [address],  [place], [place]   [facility] Programs - Intensive Outpatient [address]een [address], [place], [place]   [facility] ([place].) [place].,  [place], [place] [phone] or [phone]   [facility]idential Treatment Services (RTS) [address] [place], [employer] Accepts Medicaid  [facility] [place] [address]  [place] [phone] Substance Abuse/Addiction Treatment   [facility] Organization         Address  Phone  Notes  [facility]  [phone]   Domenic Schwab, PhD [address] Arlis [place], [place]   [phone] or [phone]   [place], [place] [phone]   [facility] [address], [place], [place] [phone] Insurance/Medicaid/sponsorship through [facility] and Families [address] [place]                                    [place], [place] [phone] [place] [address], [place] [phone]    Dr. Adele Schilder  [phone]   [facility]  Dept. 1) [address], [place] 2) [place] 3)  [place] [address], [place] [phone]  [phone]   [place] [phone] or [phone] (After Hours)

## 2014-02-07 NOTE — ED Provider Notes (Addendum)
CSN: [id number]     Arrival date & time 02/07/14  1522 History   First MD Initiated Contact with Patient 02/07/14 1612     Chief Complaint  Patient presents with  . Anxiety     (Consider location/radiation/quality/duration/timing/severity/associated sxs/prior Treatment) HPI Comments: Patient presents to the ER for evaluation of pain in the left shoulder and left arm. Patient reports that this has been ongoing for days. She was seen at [facility] 3 days ago for this. Her cardiac workup was negative and she was discharged. The patient went to see her primary care doctor for followup today and was told it was likely anxiety, she was given an antidepressant. Patient reports that the pain is continuous and she is concerned because multiple members of her family have had heart problems.  Has been continuous since at least Saturday. She has some tingling in her left arm. She reports that the pain worsens if she tries to use her arm. She has significant worsening of pain if she raises her arm above her head. She denies injury.  Patient is a 43 y.o. female presenting with anxiety.  Anxiety Associated symptoms include chest pain.    Past Medical History  Diagnosis Date  . Cancer   . Arthritis   . DDD (degenerative disc disease)   . Bipolar 1 disorder   . Panic attacks   . Chronic back pain   . Anemia   . Blood transfusion without reported diagnosis   . Emphysema of lung   . GERD (gastroesophageal reflux disease)   . Hyperlipidemia   . PTSD (post-traumatic stress disorder)    Past Surgical History  Procedure Laterality Date  . Abdominal surgery    . Tubal ligation    . Back surgery    . Hip surgery     Family History  Problem Relation Age of Onset  . Cancer Mother     ovarian  . Heart disease Mother   . Cancer Maternal Aunt     breast  . Cancer Maternal Grandmother     ovarian   History  Substance Use Topics  . Smoking status: Current Every Day Smoker -- 1.00 packs/day   Types: Cigarettes    Start date: 07/28/2005  . Smokeless tobacco: Never Used  . Alcohol Use: Yes   OB History   Grav Para Term Preterm Abortions TAB SAB Ect Mult Living                 Review of Systems  Cardiovascular: Positive for chest pain.  Musculoskeletal: Positive for arthralgias.  All other systems reviewed and are negative.     Allergies  Codeine  Home Medications   Prior to Admission medications   Medication Sig Start Date End Date Taking? Authorizing Provider  ALPRAZolam Duanne Moron) 1 MG tablet Take 1 tablet (1 mg total) by mouth 2 (two) times daily as needed for anxiety. 09/09/13  Yes Lysbeth Penner, FNP  Doxylamine Succinate, Sleep, (SLEEP AID PO) Take 2 tablets by mouth at bedtime.   Yes Historical Provider, MD  Melatonin 3 MG TABS Take 6 mg by mouth at bedtime. Dissolving tablets   Yes Historical Provider, MD  escitalopram (LEXAPRO) 10 MG tablet Take 1 tablet (10 mg total) by mouth daily. 02/07/14   Mary-Margaret Hassell Done, FNP  temazepam (RESTORIL) 7.5 MG capsule Take 1 capsule (7.5 mg total) by mouth at bedtime as needed for sleep. 02/07/14   Mary-Margaret Hassell Done, FNP   BP 145/86  Pulse 77  Temp(Src)  97.9 F (36.6 C) (Oral)  Resp 22  Ht 5\' 1"  (1.549 m)  Wt 149 lb (67.586 kg)  BMI 28.17 kg/m2  SpO2 99% Physical Exam  Constitutional: She is oriented to person, place, and time. She appears well-developed and well-nourished. No distress.  HENT:  Head: Normocephalic and atraumatic.  Right Ear: Hearing normal.  Left Ear: Hearing normal.  Nose: Nose normal.  Mouth/Throat: Oropharynx is clear and moist and mucous membranes are normal.  Eyes: Conjunctivae and EOM are normal. Pupils are equal, round, and reactive to light.  Neck: Normal range of motion. Neck supple.  Cardiovascular: Regular rhythm, S1 normal and S2 normal.  Exam reveals no gallop and no friction rub.   No murmur heard. Pulmonary/Chest: Effort normal and breath sounds normal. No respiratory distress.  She exhibits no tenderness.  Abdominal: Soft. Normal appearance and bowel sounds are normal. There is no hepatosplenomegaly. There is no tenderness. There is no rebound, no guarding, no tenderness at McBurney's point and negative Murphy's sign. No hernia.  Musculoskeletal:       Left shoulder: She exhibits decreased range of motion (painful inhibition) and tenderness. She exhibits no deformity.  Neurological: She is alert and oriented to person, place, and time. She has normal strength. No cranial nerve deficit or sensory deficit. Coordination normal. GCS eye subscore is 4. GCS verbal subscore is 5. GCS motor subscore is 6.  Skin: Skin is warm, dry and intact. No rash noted. No cyanosis.  Psychiatric: She has a normal mood and affect. Her speech is normal and behavior is normal. Thought content normal.    ED Course  Procedures (including critical care time) Labs Review Labs Reviewed  TROPONIN I  BASIC METABOLIC PANEL    Imaging Review No results found.   EKG Interpretation None      MDM   Final diagnoses:  None   musculoskeletal shoulder and chest wall pain Possible cervical radiculopathy  Patient presents to the ER for evaluation of chest pain. Patient is complaining of pain in the left shoulder area, traveling down the left arm. She has been seen for this pain previously. Patient does report significant family history of heart disease, however her pain is very atypical. It has been present for several days now without any changes in EKG, normal troponin once again. It is extremely reproducible with outpatient of the left shoulder area. Patient cannot raise her arm above her head because of severe pain with movement at the shoulder. This is reproducing the pain that brought her in. This is clearly musculoskeletal in nature. She does report a history of cervical disc problems and likely is experiencing some cervical radiculopathy. I did perform a d-dimer which was slightly elevated.  CT scan was therefore performed to rule out PE and evaluate her aortic abnormalities. No acute abnormalities were seen. Patient does not require any further workup today for admission based on her negative cardiac workup for the second time in 3 days. Patient reassured, for musculoskeletal pain and followup with primary doctor.    Orpah Greek, MD 02/07/14 1914  Orpah Greek, MD 02/07/14 [phone]

## 2014-02-07 NOTE — Patient Instructions (Signed)
Stress and Stress Management Stress is a normal reaction to life events. It is what you feel when life demands more than you are used to or more than you can handle. Some stress can be useful. For example, the stress reaction can help you catch the last bus of the day, study for a test, or meet a deadline at work. But stress that occurs too often or for too long can cause problems. It can affect your emotional health and interfere with relationships and normal daily activities. Too much stress can weaken your immune system and increase your risk for physical illness. If you already have a medical problem, stress can make it worse. CAUSES  All sorts of life events may cause stress. An event that causes stress for one person may not be stressful for another person. Major life events commonly cause stress. These may be positive or negative. Examples include losing your job, moving into a new home, getting married, having a baby, or losing a loved one. Less obvious life events may also cause stress, especially if they occur day after day or in combination. Examples include working long hours, driving in traffic, caring for children, being in debt, or being in a difficult relationship. SIGNS AND SYMPTOMS Stress may cause emotional symptoms including, the following:  Anxiety--This is feeling worried, afraid, on edge, overwhelmed, or out of control.  Anger--This is feeling irritated or impatient.  Depression--This is feeling sad, down, helpless, or guilty.  Difficulty focusing, remembering, or making decisions. Stress may cause physical symptoms, including the following:   Aches and pains--These may affect your head, neck, back, stomach, or other areas of your body.  Tight muscles or clenched jaw.  Low energy or trouble sleeping. Stress may cause unhealthy behaviors, including the following:   Eating to feel better (overeating) or skipping meals.  Sleeping too little, too much, or  both.  Working too much or putting off tasks (procrastination).  Smoking, drinking alcohol, or using drugs to feel better. DIAGNOSIS  Stress is diagnosed through an assessment by your health care provider. Your health care provider will ask questions about your symptoms and any stressful life events.Your health care provider will also ask about your medical history and may order blood tests or other tests. Certain medical conditions and medicine can cause physical symptoms similar to stress. Mental illness can cause emotional symptoms and unhealthy behaviors similar to stress. Your health care provider may refer you to a mental health professional for further evaluation.  TREATMENT  Stress management is the recommended treatment for stress.The goals of stress management are reducing stressful life events and coping with stress in healthy ways.  Techniques for reducing stressful life events include the following:  Stress identification--Self-monitor for stress and identify what causes stress for you. These skills may help you to avoid some stressful events.  Time management--Set your priorities, keep a calendar of events, and learn to say "no." These tools can help you avoid making too many commitments. Techniques for coping with stress include the following:  Rethinking the problem--Try to think realistically about stressful events rather than ignoring them or overreacting. Try to find the positives in a stressful situation rather than focusing on the negatives.  Exercise--Physical exercise can release both physical and emotional tension. The key is to find a form of exercise you enjoy and do it regularly.  Relaxation techniques. These relax the body and mind. Examples include yoga, meditation, tai chi, biofeedback, deep breathing, progressive muscle relaxation, listening to music, being  out in nature, journaling, and other hobbies. Again, the key is to find one or more that you enjoy and can  do regularly.  Healthy lifestyle--Eat a balanced diet, get plenty of sleep, and do not smoke. Avoid using alcohol or drugs to relax.  Strong support network--Spend time with family, friends, or other people you enjoy being around.Express your feelings and talk things over with someone you trust. Counseling or talktherapy with a mental health professional may be helpful if you are having difficulty managing stress on your own. Medicine is typically not recommended for the treatment of stress.Talk to your health care provider if you think you need medicine for symptoms of stress. HOME CARE INSTRUCTIONS:  Keep all follow up appointments with your health care provider.  Only take any prescribed medicines as directed by your health care provider.  Talk to your health care provider before starting any new prescription or over-the-counter medicines. SEEK MEDICAL CARE IF:  Your symptoms get worse or you start having new symptoms.  You feel overwhelmed by your problems and can no longer manage them on your own. SEEK IMMEDIATE MEDICAL CARE IF:  You feel like hurting yourself or someone else. Document Released: 01/07/2001 Document Revised: 07/19/2013 Document Reviewed: 03/08/2013 [facility] Patient Information 2015 [place], [place]. This information is not intended to replace advice given to you by your health care provider. Make sure you discuss any questions you have with your health care provider. Insomnia Insomnia is frequent trouble falling and/or staying asleep. Insomnia can be a long term problem or a short term problem. Both are common. Insomnia can be a short term problem when the wakefulness is related to a certain stress or worry. Long term insomnia is often related to ongoing stress during waking hours and/or poor sleeping habits. Overtime, sleep deprivation itself can make the problem worse. Every little thing feels more severe because you are overtired and your ability to cope is  decreased. CAUSES   Stress, anxiety, and depression.  Poor sleeping habits.  Distractions such as TV in the bedroom.  Naps close to bedtime.  Engaging in emotionally charged conversations before bed.  Technical reading before sleep.  Alcohol and other sedatives. They may make the problem worse. They can hurt normal sleep patterns and normal dream activity.  Stimulants such as caffeine for several hours prior to bedtime.  Pain syndromes and shortness of breath can cause insomnia.  Exercise late at night.  Changing time zones may cause sleeping problems (jet lag). It is sometimes helpful to have someone observe your sleeping patterns. They should look for periods of not breathing during the night (sleep apnea). They should also look to see how long those periods last. If you live alone or observers are uncertain, you can also be observed at a sleep clinic where your sleep patterns will be professionally monitored. Sleep apnea requires a checkup and treatment. Give your caregivers your medical history. Give your caregivers observations your family has made about your sleep.  SYMPTOMS   Not feeling rested in the morning.  Anxiety and restlessness at bedtime.  Difficulty falling and staying asleep. TREATMENT   Your caregiver may prescribe treatment for an underlying medical disorders. Your caregiver can give advice or help if you are using alcohol or other drugs for self-medication. Treatment of underlying problems will usually eliminate insomnia problems.  Medications can be prescribed for short time use. They are generally not recommended for lengthy use.  Over-the-counter sleep medicines are not recommended for lengthy use. They can  be habit forming.  You can promote easier sleeping by making lifestyle changes such as:  Using relaxation techniques that help with breathing and reduce muscle tension.  Exercising earlier in the day.  Changing your diet and the time of your  last meal. No night time snacks.  Establish a regular time to go to bed.  Counseling can help with stressful problems and worry.  Soothing music and white noise may be helpful if there are background noises you cannot remove.  Stop tedious detailed work at least one hour before bedtime. HOME CARE INSTRUCTIONS   Keep a diary. Inform your caregiver about your progress. This includes any medication side effects. See your caregiver regularly. Take note of:  Times when you are asleep.  Times when you are awake during the night.  The quality of your sleep.  How you feel the next day. This information will help your caregiver care for you.  Get out of bed if you are still awake after 15 minutes. Read or do some quiet activity. Keep the lights down. Wait until you feel sleepy and go back to bed.  Keep regular sleeping and waking hours. Avoid naps.  Exercise regularly.  Avoid distractions at bedtime. Distractions include watching television or engaging in any intense or detailed activity like attempting to balance the household checkbook.  Develop a bedtime ritual. Keep a familiar routine of bathing, brushing your teeth, climbing into bed at the same time each night, listening to soothing music. Routines increase the success of falling to sleep faster.  Use relaxation techniques. This can be using breathing and muscle tension release routines. It can also include visualizing peaceful scenes. You can also help control troubling or intruding thoughts by keeping your mind occupied with boring or repetitive thoughts like the old concept of counting sheep. You can make it more creative like imagining planting one beautiful flower after another in your backyard garden.  During your day, work to eliminate stress. When this is not possible use some of the previous suggestions to help reduce the anxiety that accompanies stressful situations. MAKE SURE YOU:   Understand these instructions.  Will  watch your condition.  Will get help right away if you are not doing well or get worse. Document Released: 07/11/2000 Document Revised: 10/06/2011 Document Reviewed: 08/11/2007 [facility] Patient Information 2015 [place], [place]. This information is not intended to replace advice given to you by your health care provider. Make sure you discuss any questions you have with your health care provider.

## 2014-02-07 NOTE — ED Notes (Signed)
Pt states went to [facility] couple days ago for cp, chest and lungs checked out ok. Pt states f/u'd with PCP today and was told has anxiety. Pt states was given antidepressant and another medication. States was leaving PCP when left arm started to become "tingling" and "scared her" so she came here. States hasn't had time to fill her new prescriptions. Pt states has been off of xanax for months and panic attacks have been increasing and "will be better back on xanax".

## 2014-02-07 NOTE — ED Provider Notes (Signed)
MSE was initiated and I personally evaluated the patient and placed orders (if any) at  4:41 PM on February 07, 2014.  The patient appears stable so that the remainder of the MSE may be completed by another provider.  Pt presents with persistent left chest pressure, shortness of breath with radiation of pain into her left upper arm.  She was seen by her pcp this am and diagnosed with anxiety. Was seen at [facility] 3 days ago for chest with negative workup.  Pt concerned there is something else wrong given elevated bp today and other sx although admits to increased anxiety with home stressors.     PE:  Heart rrr, lungs ctab.  VSS, hypertensive, anxious.  Evalee Jefferson, PA-C 02/07/14 1646

## 2014-02-07 NOTE — ED Notes (Signed)
Pt complain of pain in left shoulder and arms. States she was at [facility] Saturday with same symptoms. States she had a complete work up and was told ever thing was normal. States she was [place] today for follow up and was told she was depressed and anxious

## 2014-02-07 NOTE — Progress Notes (Signed)
   Subjective:    Patient ID: Jenna Zamora, female    DOB: [birth year], 43 y.o.   MRN: [id number]  HPI Patient is here today for hospital follow up- SHe went to the ER at [facility] with bil lower ext swelling, and chest pain- Ekg \\was  normal- all th eER did was give her percocet and send her home. She called 911 again yesterday with C/O chest pain- They came and did EKG and did not show heart attack so she refused to go back to the hospital. [facility] stayed in the bed with legs elevated all day yesterday and swelling is better- Patient says that she is under a lot of stress- marital problems, son is in drug rehab and illnesses in the family.  * also having trouble sleeping at night - use to be on restoril  Review of Systems  Constitutional: Negative.   HENT: Negative.   Cardiovascular: Positive for chest pain and leg swelling.  Genitourinary: Negative.   Neurological: Negative.   Psychiatric/Behavioral: The patient is nervous/anxious.   All other systems reviewed and are negative.      Objective:   Physical Exam  Constitutional: She is oriented to person, place, and time. She appears well-developed and well-nourished.  Cardiovascular: Normal rate, regular rhythm and normal heart sounds.   Pulmonary/Chest: Effort normal and breath sounds normal.  Neurological: She is alert and oriented to person, place, and time.  Skin: Skin is warm and dry.  Psychiatric: She has a normal mood and affect. Her behavior is normal. Judgment and thought content normal.   BP 140/80  Pulse 98  Temp(Src) 98.7 F (37.1 C) (Oral)  Ht 5\' 1"  (1.549 m)  Wt 149 lb 12.8 oz (67.949 kg)  BMI 28.32 kg/m2        Assessment & Plan:  1. Panic attacks stress management exercsie - escitalopram (LEXAPRO) 10 MG tablet; Take 1 tablet (10 mg total) by mouth daily.  Dispense: 30 tablet; Refill: 3  2. Insomnia Bedtime ritual - temazepam (RESTORIL) 7.5 MG capsule; Take 1 capsule (7.5 mg total) by mouth at bedtime as  needed for sleep.  Dispense: 30 capsule; Refill: 1  Follow up in 2-3 minths  Mary-Margaret Hassell Done, FNP

## 2014-02-07 NOTE — ED Notes (Signed)
Discharge instructions given and reviewed with patient.  Prescriptions given for Vicodin, Flexeril and Prednisone.  Patient instructed to take medications as directed and sedating effects of Vicodin and Flexeril.  Patient verbalized understanding to take medications as directed and to follow up with PMD.  Patient ambulatory with slow, steady gait; discharged home in good condition in care of spouse.

## 2014-02-08 NOTE — ED Notes (Signed)
Jenna Zamora, from [employer], called and inquired about changing dose of Fexmid px from 7.5 mg dose to either 5mg  or 10mg  due to a reduced price. Dr. Roderic Palau consulted and give verbal order for pharmacy to change px dose to 5mg . Verified change in prescription with verbal readback with EDP. Pharmacy aware of reccommended change of dose.

## 2014-02-10 NOTE — ED Provider Notes (Signed)
Medical screening examination/treatment/procedure(s) were performed by non-physician practitioner and as supervising physician I was immediately available for consultation/collaboration.   EKG Interpretation None        Orpah Greek, MD 02/10/14 [phone]

## 2014-02-15 ENCOUNTER — Telehealth: Payer: Self-pay | Admitting: *Deleted

## 2014-02-15 DIAGNOSIS — M5010 Cervical disc disorder with radiculopathy, unspecified cervical region: Secondary | ICD-10-CM

## 2014-02-15 NOTE — Telephone Encounter (Signed)
Called patient to follow up with her in reference to her call to the Office of Patient Experience. I discussed with patient that Dr. Laurance Flatten reviewed her chart and will refer her an orthopedic surgeon for her neck and arm pain. He feels she may need more extensive x-rays. Patient is agreeable to seeing Dr. Luna Glasgow in [place] and wants to see him as soon as possible. She also is requesting a refill on her pain medication from [place] until she can get in to see Dr. Luna Glasgow. The pain medication was given to her from the ER doctor. She also request a refill on her Xanax 1mg  because Shelah Lewandowsky only gave her #30 back on 09/09/2013 and she was used to getting # 120. She reports she has been having multiple panic attacks since her Xanax has been cut back. She reports she has PTSD and has been having more frequent attacks due to all the pain that she has been having in her neck. I told her I would send a message to Dr. Laurance Flatten requesting the pain medication refill and a refill on her xanax. I told her I would enter the order for Dr. Luna Glasgow  And call her back in the am and give her an update on the referral and her medication refill requests. Patient was agreeable and voices understanding of plan of care.

## 2014-02-16 ENCOUNTER — Other Ambulatory Visit: Payer: Self-pay | Admitting: *Deleted

## 2014-02-16 MED ORDER — HYDROCODONE-ACETAMINOPHEN 5-325 MG PO TABS: 2.0000 | ORAL_TABLET | ORAL | Status: DC | PRN

## 2014-02-16 MED ORDER — ALPRAZOLAM 1 MG PO TABS: 1.0000 mg | ORAL_TABLET | Freq: Two times a day (BID) | ORAL | Status: DC | PRN

## 2014-02-16 NOTE — Telephone Encounter (Addendum)
Patient would like a refill on the pain med that the ER doctor gave her until she can get an appointment with Dr. Luna Glasgow and a refill on her Xanax 1mg  BID. It last filled in 09/09/13 #30. Discussed with Dr. Laurance Flatten that Dr. Brooke Bonito office has accepted her as a patient and they will be contacting her with an appointment date and time. Dr. Laurance Flatten will refill both one time only.   Rx's approved by Dr. Laurance Flatten signed and printed and patient notified they are ready to be picked up.

## 2014-02-16 NOTE — Telephone Encounter (Signed)
Error note

## 2014-02-21 ENCOUNTER — Other Ambulatory Visit (HOSPITAL_COMMUNITY): Payer: Self-pay | Admitting: Orthopaedic Surgery

## 2014-02-21 DIAGNOSIS — R202 Paresthesia of skin: Secondary | ICD-10-CM

## 2014-02-21 DIAGNOSIS — M542 Cervicalgia: Secondary | ICD-10-CM

## 2014-02-22 ENCOUNTER — Ambulatory Visit (HOSPITAL_COMMUNITY)
Admission: RE | Admit: 2014-02-22 | Discharge: 2014-02-22 | Disposition: A | Discharge status: Home / Self Care | Payer: Medicare Other | Source: Ambulatory Visit | Attending: Orthopaedic Surgery | Admitting: Orthopaedic Surgery

## 2014-02-22 DIAGNOSIS — M4802 Spinal stenosis, cervical region: Secondary | ICD-10-CM | POA: Diagnosis not present

## 2014-02-22 DIAGNOSIS — R202 Paresthesia of skin: Secondary | ICD-10-CM

## 2014-02-22 DIAGNOSIS — M542 Cervicalgia: Secondary | ICD-10-CM

## 2014-02-22 DIAGNOSIS — R209 Unspecified disturbances of skin sensation: Secondary | ICD-10-CM | POA: Diagnosis present

## 2014-02-22 MED ORDER — GADOBENATE DIMEGLUMINE 529 MG/ML IV SOLN
14.0000 mL | Freq: Once | INTRAVENOUS | Status: AC | PRN
  Administered 2014-02-22: 13 mL via INTRAVENOUS

## 2014-02-24 ENCOUNTER — Other Ambulatory Visit (HOSPITAL_COMMUNITY): Payer: Medicare Other

## 2014-03-13 ENCOUNTER — Other Ambulatory Visit: Payer: Self-pay | Admitting: Family Medicine

## 2014-03-16 NOTE — Telephone Encounter (Signed)
Patient last seen in office on 02-07-14. Rx last filled on 02-16-14 for #30. Please advise. If approved please route to Pool B so nurse can phone in to pharmacy

## 2014-03-16 NOTE — Telephone Encounter (Signed)
Called in.

## 2014-03-16 NOTE — Telephone Encounter (Signed)
Please call in xanax with 0 refills 

## 2014-03-18 ENCOUNTER — Other Ambulatory Visit: Payer: Self-pay | Admitting: Family Medicine

## 2014-04-03 ENCOUNTER — Other Ambulatory Visit: Payer: Self-pay | Admitting: Nurse Practitioner

## 2014-04-04 NOTE — Telephone Encounter (Signed)
Last refill 03/16/14. Last ov 02/07/14. If approved call to [facility].

## 2014-04-04 NOTE — Telephone Encounter (Signed)
rx called into pharmacy

## 2014-04-04 NOTE — Telephone Encounter (Signed)
Please call in xanax with 0 refills 

## 2014-04-14 ENCOUNTER — Telehealth: Payer: Self-pay | Admitting: *Deleted

## 2014-04-14 NOTE — Telephone Encounter (Signed)
Patient states that she has been on restoril 30 for years and her new prescription is only for 7.5 and wants to know if you can please increase back to the 30mg 

## 2014-04-16 MED ORDER — TEMAZEPAM 15 MG PO CAPS: 15.0000 mg | ORAL_CAPSULE | Freq: Every evening | ORAL | Status: DC | PRN

## 2014-04-16 NOTE — Telephone Encounter (Signed)
ptient will need to pick up rx- will not go above 15mg  beacause she is alson on xanax 1 mg TID

## 2014-04-18 NOTE — Telephone Encounter (Signed)
Patient aware. Unable to locate printed prescription. Rx called into [facility] in [place].

## 2014-05-03 ENCOUNTER — Other Ambulatory Visit: Payer: Self-pay | Admitting: Nurse Practitioner

## 2014-05-05 NOTE — Telephone Encounter (Signed)
Please call in xanax with 0 refills 

## 2014-05-05 NOTE — Telephone Encounter (Signed)
Last filled 04/04/14, last seen 02/07/14. Pt uses [facility] [phone]

## 2014-05-05 NOTE — Telephone Encounter (Signed)
Called into pharmacy

## 2014-05-11 ENCOUNTER — Ambulatory Visit: Payer: Medicare Other | Admitting: Nurse Practitioner

## 2014-08-24 ENCOUNTER — Ambulatory Visit (HOSPITAL_COMMUNITY): Payer: Medicare Other | Admitting: Psychiatry

## 2014-11-01 ENCOUNTER — Other Ambulatory Visit: Payer: Self-pay | Admitting: Nurse Practitioner

## 2014-11-21 ENCOUNTER — Other Ambulatory Visit: Payer: Self-pay | Admitting: Women's Health

## 2014-11-23 ENCOUNTER — Other Ambulatory Visit: Payer: Self-pay | Admitting: Obstetrics and Gynecology

## 2014-12-04 IMAGING — CT CT ANGIO CHEST
2 of 6 series · 6 of 36 positions shown · IV contrast (Omnipaque 300)
Comparison: Chest radiograph February 07, 2014 and CT of the chest May

CLINICAL DATA: Left chest pain radiating to left arm and shortness
of breath, bilateral lower extremity swelling. Recent ER visit for
similar symptoms.

EXAM:
CT ANGIOGRAPHY CHEST WITH CONTRAST
TECHNIQUE: Multidetector CT imaging of the chest was performed using the
standard protocol during bolus administration of intravenous
contrast. Multiplanar CT image reconstructions and MIPs were
obtained to evaluate the vascular anatomy.
CONTRAST:  100mL OMNIPAQUE IOHEXOL 350 MG/ML SOLN

[Series 5: pe 3.0 b40f · axial · 0.59mm/px · z∈[-271,-91]mm · 5 of 90 slices shown]
[im 15/90  lung]
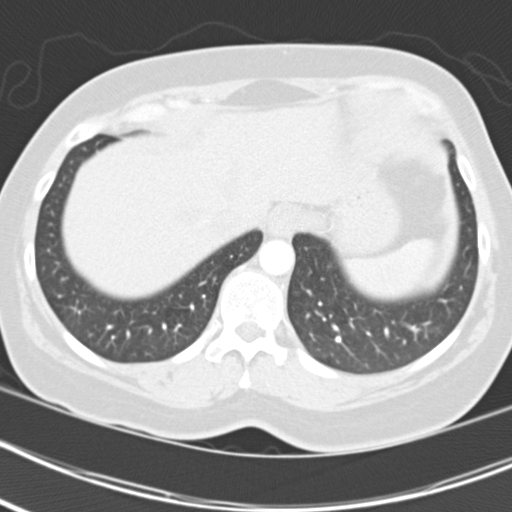
[im 30/90  mediastinal]
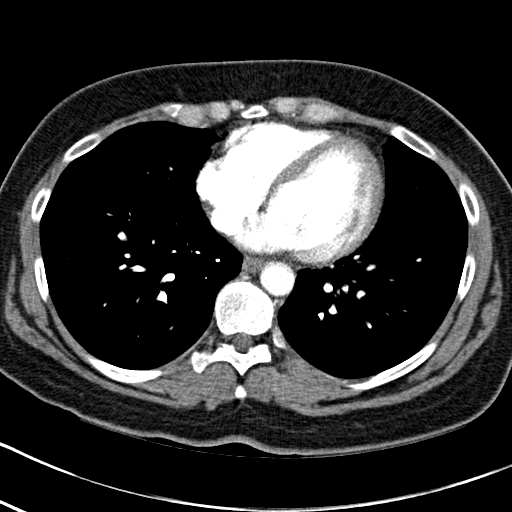
[im 45/90  lung]
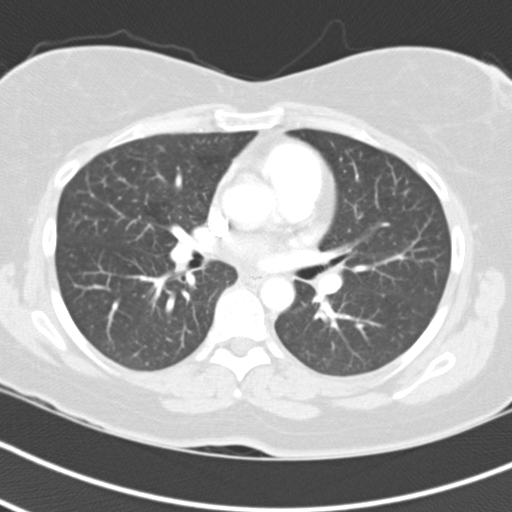
[im 60/90  mediastinal]
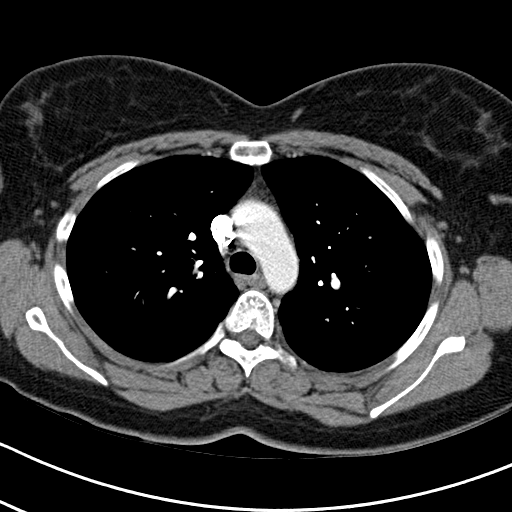
[im 75/90  lung]
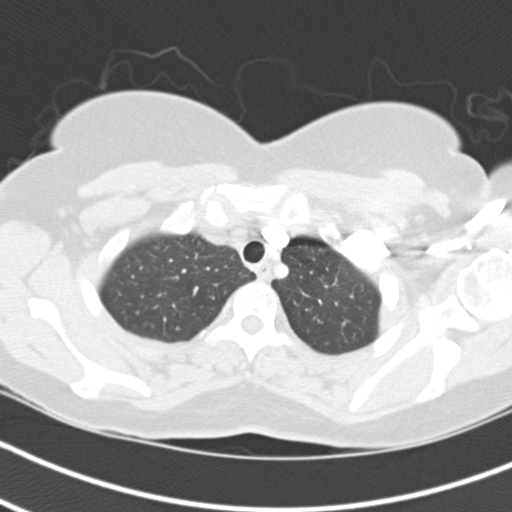

[Series 7: mpr coronal pe 3mm · coronal · 0.54mm/px · 1 of 68 slices shown]
[im 34/68  mediastinal]
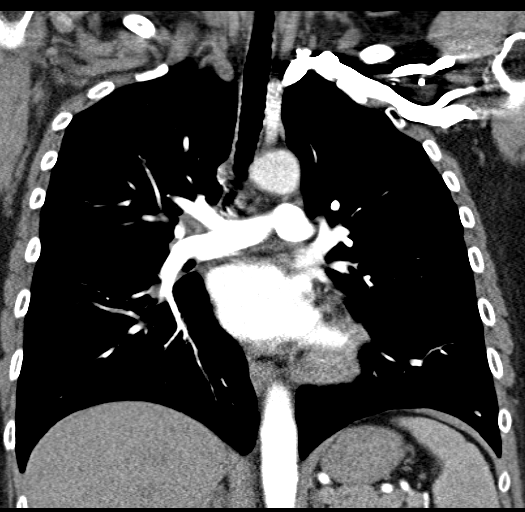

[6 of 36 positions shown; findings below may reference images not displayed]

FINDINGS: Adequate contrast opacification of the pulmonary artery's. Main
pulmonary artery is not enlarged. No pulmonary arterial filling
defects to the level of the subsegmental branches.

Heart and pericardium are unremarkable, no right heart strain.
Thoracic aorta is normal course and caliber, unremarkable. No
lymphadenopathy by CT size criteria ; 6 mm right hilar short axis
lymph node. Tracheobronchial tree is patent, no pneumothorax. Mild
bronchial wall thickening. Mild heterogeneous lung attenuation.
Stable appearance of right lower lobe, lateral segment 4 mm sub
solid pulmonary nodules, as right middle lobe, lateral segment 4 mm
sub solid pulmonary nodule, lingular 3 mm sub solid pulmonary
nodule. Mild right apical bullous changes

Included view of the abdomen is unremarkable. Visualized soft
tissues and included osseous structures appear normal, Schmorl's
nodes. Mild S-type scoliosis.

Review of the MIP images confirms the above findings.
IMPRESSION: No acute pulmonary embolism.

Mild bronchial wall thickening may reflect bronchitis without focal
consolidation. Mild heterogeneous lung attenuation can be seen with
small airway disease.

Similar appearance of multiple sub solid measuring less than is 5
mm.

  By: Dionne Blu

## 2014-12-13 ENCOUNTER — Other Ambulatory Visit: Payer: Self-pay | Admitting: Obstetrics and Gynecology

## 2015-01-03 ENCOUNTER — Other Ambulatory Visit: Payer: Self-pay | Admitting: Obstetrics and Gynecology

## 2015-01-31 ENCOUNTER — Other Ambulatory Visit: Payer: Self-pay | Admitting: Obstetrics and Gynecology

## 2015-02-19 ENCOUNTER — Emergency Department (HOSPITAL_COMMUNITY)
Admission: EM | Admit: 2015-02-19 | Discharge: 2015-02-20 | Disposition: A | Discharge status: Home / Self Care | Payer: Medicare Other | Attending: Emergency Medicine | Admitting: Emergency Medicine

## 2015-02-19 ENCOUNTER — Encounter (HOSPITAL_COMMUNITY): Payer: Self-pay | Admitting: *Deleted

## 2015-02-19 DIAGNOSIS — Z8739 Personal history of other diseases of the musculoskeletal system and connective tissue: Secondary | ICD-10-CM | POA: Insufficient documentation

## 2015-02-19 DIAGNOSIS — Z8719 Personal history of other diseases of the digestive system: Secondary | ICD-10-CM | POA: Insufficient documentation

## 2015-02-19 DIAGNOSIS — G8929 Other chronic pain: Secondary | ICD-10-CM | POA: Diagnosis not present

## 2015-02-19 DIAGNOSIS — Y929 Unspecified place or not applicable: Secondary | ICD-10-CM | POA: Diagnosis not present

## 2015-02-19 DIAGNOSIS — L259 Unspecified contact dermatitis, unspecified cause: Secondary | ICD-10-CM | POA: Insufficient documentation

## 2015-02-19 DIAGNOSIS — Y939 Activity, unspecified: Secondary | ICD-10-CM | POA: Insufficient documentation

## 2015-02-19 DIAGNOSIS — Z862 Personal history of diseases of the blood and blood-forming organs and certain disorders involving the immune mechanism: Secondary | ICD-10-CM | POA: Diagnosis not present

## 2015-02-19 DIAGNOSIS — Z79899 Other long term (current) drug therapy: Secondary | ICD-10-CM | POA: Insufficient documentation

## 2015-02-19 DIAGNOSIS — F410 Panic disorder [episodic paroxysmal anxiety] without agoraphobia: Secondary | ICD-10-CM | POA: Diagnosis not present

## 2015-02-19 DIAGNOSIS — Y999 Unspecified external cause status: Secondary | ICD-10-CM | POA: Diagnosis not present

## 2015-02-19 DIAGNOSIS — J439 Emphysema, unspecified: Secondary | ICD-10-CM | POA: Insufficient documentation

## 2015-02-19 DIAGNOSIS — Z720 Tobacco use: Secondary | ICD-10-CM | POA: Diagnosis not present

## 2015-02-19 DIAGNOSIS — S0502XA Injury of conjunctiva and corneal abrasion without foreign body, left eye, initial encounter: Secondary | ICD-10-CM | POA: Diagnosis not present

## 2015-02-19 DIAGNOSIS — S0501XA Injury of conjunctiva and corneal abrasion without foreign body, right eye, initial encounter: Secondary | ICD-10-CM | POA: Insufficient documentation

## 2015-02-19 DIAGNOSIS — X58XXXA Exposure to other specified factors, initial encounter: Secondary | ICD-10-CM | POA: Diagnosis not present

## 2015-02-19 DIAGNOSIS — Z8639 Personal history of other endocrine, nutritional and metabolic disease: Secondary | ICD-10-CM | POA: Diagnosis not present

## 2015-02-19 DIAGNOSIS — H5713 Ocular pain, bilateral: Secondary | ICD-10-CM | POA: Diagnosis present

## 2015-02-19 DIAGNOSIS — F419 Anxiety disorder, unspecified: Secondary | ICD-10-CM | POA: Insufficient documentation

## 2015-02-19 DIAGNOSIS — F319 Bipolar disorder, unspecified: Secondary | ICD-10-CM | POA: Diagnosis not present

## 2015-02-19 NOTE — ED Notes (Signed)
Patient was barely verbal, most information from visitor, patient was wheeled to eye chart, didn't stand to do visual acuity test, couldn't see pass the second level of eye chart, and couldn't open left eye.

## 2015-02-19 NOTE — ED Notes (Signed)
Pt with sudden pain and drainage to bilateral, pt tried aloe at home for it

## 2015-02-20 DIAGNOSIS — S0501XA Injury of conjunctiva and corneal abrasion without foreign body, right eye, initial encounter: Secondary | ICD-10-CM | POA: Diagnosis not present

## 2015-02-20 MED ORDER — FLUORESCEIN SODIUM 1 MG OP STRP
2.0000 | ORAL_STRIP | Freq: Once | OPHTHALMIC | Status: AC
  Administered 2015-02-20: 2 via OPHTHALMIC
  Filled 2015-02-20: qty 2

## 2015-02-20 MED ORDER — ERYTHROMYCIN 5 MG/GM OP OINT: TOPICAL_OINTMENT | OPHTHALMIC | Status: DC

## 2015-02-20 MED ORDER — OXYCODONE-ACETAMINOPHEN 5-325 MG PO TABS: 1.0000 | ORAL_TABLET | Freq: Four times a day (QID) | ORAL | Status: DC | PRN

## 2015-02-20 MED ORDER — TETRACAINE HCL 0.5 % OP SOLN
2.0000 [drp] | Freq: Once | OPHTHALMIC | Status: AC
  Administered 2015-02-20: 2 [drp] via OPHTHALMIC
  Filled 2015-02-20: qty 2

## 2015-02-20 NOTE — ED Notes (Signed)
Patient was given a prepackage of Oxycodone-Acetaminophen and instructions on use, patient verbally understands.  Patient is know able to open eyes and talk, walked out of ED.

## 2015-02-20 NOTE — ED Provider Notes (Signed)
TIME SEEN: 12:20 AM   CHIEF COMPLAINT: Bilateral Eye Pain  HPI:  Pt is a 44 y.o. female with history of bipolar disorder, panic attacks, PTSD who presents to the emergency department with eye pain. History is limited as patient is not cooperative with exam and will not answer questions. Per husband, she was complaining of burning, bilateral eye pain onset 4-5 hours ago and was constantly rubbing them. Pt applied aloe and triple antibiotic ointment to the lids and around the eyes with no relief. She also c/o CP after arriving at the ED. She has not slept for the past 24 hours. She has not had any SI, HI, or prior occurrence of a similar episode. Per husband she was normal and talking until she came into the room at the ED.   History is limited due to pt's inability to cooperate.  PAST MEDICAL HISTORY/PAST SURGICAL HISTORY:  Past Medical History  Diagnosis Date  . Arthritis   . DDD (degenerative disc disease)   . Bipolar 1 disorder   . Panic attacks   . Chronic back pain   . Anemia   . Blood transfusion without reported diagnosis   . Emphysema of lung   . GERD (gastroesophageal reflux disease)   . Hyperlipidemia   . PTSD (post-traumatic stress disorder)     MEDICATIONS:  Prior to Admission medications   Medication Sig Start Date End Date Taking? Authorizing Provider  ALPRAZolam Duanne Moron) 1 MG tablet TAKE ONE TABLET BY MOUTH TWICE DAILY AS NEEDED 05/05/14   Mary-Margaret Hassell Done, FNP  cyclobenzaprine (FEXMID) 7.5 MG tablet Take 1 tablet (7.5 mg total) by mouth 3 (three) times daily as needed for muscle spasms. 02/07/14   Orpah Greek, MD  Doxylamine Succinate, Sleep, (SLEEP AID PO) Take 2 tablets by mouth at bedtime.    Historical Provider, MD  escitalopram (LEXAPRO) 10 MG tablet Take 1 tablet (10 mg total) by mouth daily. 02/07/14   Mary-Margaret Hassell Done, FNP  HYDROcodone-acetaminophen (NORCO/VICODIN) 5-325 MG per tablet Take 2 tablets by mouth every 4 (four) hours as needed for  moderate pain. 02/16/14   Chipper Herb, MD  Melatonin 3 MG TABS Take 6 mg by mouth at bedtime. Dissolving tablets    Historical Provider, MD  predniSONE (DELTASONE) 20 MG tablet 3 tabs po daily x 3 days, then 2 tabs x 3 days, then 1.5 tabs x 3 days, then 1 tab x 3 days, then 0.5 tabs x 3 days 02/07/14   Orpah Greek, MD  temazepam (RESTORIL) 15 MG capsule Take 1 capsule (15 mg total) by mouth at bedtime as needed for sleep. 04/16/14   Mary-Margaret Hassell Done, FNP    ALLERGIES:  Allergies  Allergen Reactions  . Codeine Hives and Itching    SOCIAL HISTORY:  History  Substance Use Topics  . Smoking status: Current Every Day Smoker -- 1.00 packs/day    Types: Cigarettes    Start date: 07/28/2005  . Smokeless tobacco: Never Used  . Alcohol Use: Yes    FAMILY HISTORY: Family History  Problem Relation Age of Onset  . Cancer Mother     ovarian  . Heart disease Mother   . Cancer Maternal Aunt     breast  . Cancer Maternal Grandmother     ovarian    EXAM: BP 164/87 mmHg  Pulse 100  Temp(Src) 98.1 F (36.7 C) (Oral)  Resp 18  Ht 5' (1.524 m)  Wt 150 lb (68.04 kg)  BMI 29.30 kg/m2  SpO2 98%  CONSTITUTIONAL: Alert and will answer questions with one-word responses intermittently, appears anxious, nontoxic and afebrile HEAD: Normocephalic EYES: Conjunctivae mildly injected bilaterally with small amount of purulent drainage. Multiple, small bilateral corneal abrasions without ulceration. No foreign body. Excoriations and irritations of the skin around the eyes without rash, erythema or warmth. EOMI, PERRL ENT: normal nose; no rhinorrhea; moist mucous membranes; pharynx without lesions noted NECK: Supple, no meningismus, no LAD  CARD: RRR; S1 and S2 appreciated; no murmurs, no clicks, no rubs, no gallops RESP: Normal chest excursion without splinting or tachypnea; breath sounds clear and equal bilaterally; no wheezes, no rhonchi, no rales, no hypoxia or respiratory distress,  speaking full sentences ABD/GI: Normal bowel sounds; non-distended; soft, non-tender, no rebound, no guarding, no peritoneal signs BACK:  The back appears normal and is non-tender to palpation, there is no CVA tenderness EXT: Normal ROM in all joints; non-tender to palpation; no edema; normal capillary refill; no cyanosis, no calf tenderness or swelling    SKIN: Normal color for age and race; warm NEURO: Moves all extremities equally, sensation to light touch intact diffusely, cranial nerves II through XII intact PSYCH: No SI or HI. Pt appears very anxious. Grooming and personal hygiene are appropriate.  MEDICAL DECISION MAKING: Patient here with bilateral corneal abrasions. She also appears to have irritation around her eyes but no signs of pre-or post septal cellulitis. No corneal ulceration. No hyphema. Pain improved after tetracaine as well as her vision. By the time she left the emergency department she was talking, walking and acting normally. Given prescription for erythromycin drops in a prepack of Percocet for pain. Discussed return precautions with patient and husband. Have given her outpatient ophthalmology as well as psychiatry follow-up information. They verbalize understanding and are comfortable with plan.     EKG Interpretation  Date/Time:  Tuesday February 20 2015 00:16:55 EDT Ventricular Rate:  90 PR Interval:  112 QRS Duration: 96 QT Interval:  364 QTC Calculation: 445 R Axis:   50 Text Interpretation:  Sinus rhythm Borderline short PR interval RSR' in V1 or V2, probably normal variant Borderline repolarization abnormality No significant change since July 2015 Confirmed by Ithiel Liebler,  DO, Joncarlos Atkison [phone]) on 02/20/2015 12:19:35 AM        I personally performed the services described in this documentation, which was scribed in my presence. The recorded information has been reviewed and is accurate.   Welcome, DO 02/20/15 [id number]

## 2015-02-20 NOTE — Discharge Instructions (Signed)
Please do not put anything on your eyelids as your skin is very irritated.  You may use Benadryl 50mg  every 8 hours as needed for itching.  You have corneal abrasions to both of your eyes without signs of infection at this time.  Please follow up with an opthalmolgist.  We have given you Dr. Nida Boatman information, our opthalmologist on call.    Contact Dermatitis Contact dermatitis is a reaction to certain substances that touch the skin. Contact dermatitis can be either irritant contact dermatitis or allergic contact dermatitis. Irritant contact dermatitis does not require previous exposure to the substance for a reaction to occur.Allergic contact dermatitis only occurs if you have been exposed to the substance before. Upon a repeat exposure, your body reacts to the substance.  CAUSES  Many substances can cause contact dermatitis. Irritant dermatitis is most commonly caused by repeated exposure to mildly irritating substances, such as:  Makeup.  Soaps.  Detergents.  Bleaches.  Acids.  Metal salts, such as nickel. Allergic contact dermatitis is most commonly caused by exposure to:  Poisonous plants.  Chemicals (deodorants, shampoos).  Jewelry.  Latex.  Neomycin in triple antibiotic cream.  Preservatives in products, including clothing. SYMPTOMS  The area of skin that is exposed may develop:  Dryness or flaking.  Redness.  Cracks.  Itching.  Pain or a burning sensation.  Blisters. With allergic contact dermatitis, there may also be swelling in areas such as the eyelids, mouth, or genitals.  DIAGNOSIS  Your caregiver can usually tell what the problem is by doing a physical exam. In cases where the cause is uncertain and an allergic contact dermatitis is suspected, a patch skin test may be performed to help determine the cause of your dermatitis. TREATMENT Treatment includes protecting the skin from further contact with the irritating substance by avoiding that  substance if possible. Barrier creams, powders, and gloves may be helpful. Your caregiver may also recommend:  Steroid creams or ointments applied 2 times daily. For best results, soak the rash area in cool water for 20 minutes. Then apply the medicine. Cover the area with a plastic wrap. You can store the steroid cream in the refrigerator for a "chilly" effect on your rash. That may decrease itching. Oral steroid medicines may be needed in more severe cases.  Antibiotics or antibacterial ointments if a skin infection is present.  Antihistamine lotion or an antihistamine taken by mouth to ease itching.  Lubricants to keep moisture in your skin.  Burow's solution to reduce redness and soreness or to dry a weeping rash. Mix one packet or tablet of solution in 2 cups cool water. Dip a clean washcloth in the mixture, wring it out a bit, and put it on the affected area. Leave the cloth in place for 30 minutes. Do this as often as possible throughout the day.  Taking several cornstarch or baking soda baths daily if the area is too large to cover with a washcloth. Harsh chemicals, such as alkalis or acids, can cause skin damage that is like a burn. You should flush your skin for 15 to 20 minutes with cold water after such an exposure. You should also seek immediate medical care after exposure. Bandages (dressings), antibiotics, and pain medicine may be needed for severely irritated skin.  HOME CARE INSTRUCTIONS  Avoid the substance that caused your reaction.  Keep the area of skin that is affected away from hot water, soap, sunlight, chemicals, acidic substances, or anything else that would irritate your skin.  Do not scratch the rash. Scratching may cause the rash to become infected.  You may take cool baths to help stop the itching.  Only take over-the-counter or prescription medicines as directed by your caregiver.  See your caregiver for follow-up care as directed to make sure your skin is  healing properly. SEEK MEDICAL CARE IF:   Your condition is not better after 3 days of treatment.  You seem to be getting worse.  You see signs of infection such as swelling, tenderness, redness, soreness, or warmth in the affected area.  You have any problems related to your medicines. Document Released: 07/11/2000 Document Revised: 10/06/2011 Document Reviewed: 12/17/2010 [facility] Patient Information 2015 [place], [place]. This information is not intended to replace advice given to you by your health care provider. Make sure you discuss any questions you have with your health care provider.  Corneal Abrasion The cornea is the clear covering at the front and center of the eye. When looking at the colored portion of the eye (iris), you are looking through the cornea. This very thin tissue is made up of many layers. The surface layer is a single layer of cells (corneal epithelium) and is one of the most sensitive tissues in the body. If a scratch or injury causes the corneal epithelium to come off, it is called a corneal abrasion. If the injury extends to the tissues below the epithelium, the condition is called a corneal ulcer. CAUSES   Scratches.  Trauma.  Foreign body in the eye. Some people have recurrences of abrasions in the area of the original injury even after it has healed (recurrent erosion syndrome). Recurrent erosion syndrome generally improves and goes away with time. SYMPTOMS   Eye pain.  Difficulty or inability to keep the injured eye open.  The eye becomes very sensitive to light.  Recurrent erosions tend to happen suddenly, first thing in the morning, usually after waking up and opening the eye. DIAGNOSIS  Your health care provider can diagnose a corneal abrasion during an eye exam. Dye is usually placed in the eye using a drop or a small paper strip moistened by your tears. When the eye is examined with a special light, the abrasion shows up clearly because of the  dye. TREATMENT   Small abrasions may be treated with antibiotic drops or ointment alone.  A pressure patch may be put over the eye. If this is done, follow your doctor's instructions for when to remove the patch. Do not drive or use machines while the eye patch is on. Judging distances is hard to do with a patch on. If the abrasion becomes infected and spreads to the deeper tissues of the cornea, a corneal ulcer can result. This is serious because it can cause corneal scarring. Corneal scars interfere with light passing through the cornea and cause a loss of vision in the involved eye. HOME CARE INSTRUCTIONS  Use medicine or ointment as directed. Only take over-the-counter or prescription medicines for pain, discomfort, or fever as directed by your health care provider.  Do not drive or operate machinery if your eye is patched. Your ability to judge distances is impaired.  If your health care provider has given you a follow-up appointment, it is very important to keep that appointment. Not keeping the appointment could result in a severe eye infection or permanent loss of vision. If there is any problem keeping the appointment, let your health care provider know. SEEK MEDICAL CARE IF:   You have pain,  light sensitivity, and a scratchy feeling in one eye or both eyes.  Your pressure patch keeps loosening up, and you can blink your eye under the patch after treatment.  Any kind of discharge develops from the eye after treatment or if the lids stick together in the morning.  You have the same symptoms in the morning as you did with the original abrasion days, weeks, or months after the abrasion healed. MAKE SURE YOU:   Understand these instructions.  Will watch your condition.  Will get help right away if you are not doing well or get worse. Document Released: 07/11/2000 Document Revised: 07/19/2013 Document Reviewed: 03/21/2013 [facility] Patient Information 2015 [place], [place]. This  information is not intended to replace advice given to you by your health care provider. Make sure you discuss any questions you have with your health care provider.    Emergency Department Resource Guide 1) Find a Doctor and Pay Out of Pocket Although you won't have to find out who is covered by your insurance plan, it is a good idea to ask around and get recommendations. You will then need to call the office and see if the doctor you have chosen will accept you as a new patient and what types of options they offer for patients who are self-pay. Some doctors offer discounts or will set up payment plans for their patients who do not have insurance, but you will need to ask so you aren't surprised when you get to your appointment.  2) Contact Your Local Health Department Not all health departments have doctors that can see patients for sick visits, but many do, so it is worth a call to see if yours does. If you don't know where your local health department is, you can check in your phone book. The CDC also has a tool to help you locate your state's health department, and many state websites also have listings of all of their local health departments.  3) Find a [facility] If your illness is not likely to be very severe or complicated, you may want to try a walk in clinic. These are popping up all over the country in pharmacies, drugstores, and shopping centers. They're usually staffed by nurse practitioners or physician assistants that have been trained to treat common illnesses and complaints. They're usually fairly quick and inexpensive. However, if you have serious medical issues or chronic medical problems, these are probably not your best option.  No Primary Care Doctor: - Call Health Connect at  [phone] - they can help you locate a primary care doctor that  accepts your insurance, provides certain services, etc. - Physician Referral Service- [phone]  Chronic Pain  Problems: Organization         Address  Phone   Notes  [facility]  [phone] Patients need to be referred by their primary care doctor.   Medication Assistance: Organization         Address  Phone   Notes  [facility] Medication [facility] [address] [place], [place] [address] [phone] --Must be a resident of [facility] -- Must have NO insurance coverage whatsoever (no Medicaid/ Medicare, etc.) -- The pt. MUST have a primary care doctor that directs their care regularly and follows them in the community   MedAssist  [phone]   [employer]  [phone]    Agencies that provide inexpensive medical care: Organization  Address  Phone   Notes  [place]  [phone]   [facility]    [phone]   [facility] [place], [place] [address] [phone]   [place] [address] [place] [place] [phone]   Planned Parenthood    [phone]   [facility]    [phone]   [place] and [place] [address], [place] Phone:  [phone], Fax:  [phone] Hours of Operation:  9 am - 6 pm, M-F.  Also accepts Medicaid/Medicare and self-pay.  [facility] for [place] [address] [address], [address], [place] Phone: [phone], Fax: [phone]. Hours of Operation:  8:30 am - 5:30 pm, M-F.  Also accepts Medicaid and self-pay.  [facility] [place] [address] [place] Phone: [phone]   [place], [place], [place] [phone], Ext. [phone] Mondays & Thursdays: 7-9 AM.  First 15 patients are seen on a first come, first serve basis.    [place] Providers:  Organization         Address  Phone   Notes  [facility] [address], [place] [phone] Also  accepts self-pay patients.  [facility] [address], [place]  [phone]   [place], [address], [place] [phone]   [facility] [address] [place] [phone]   Lucianne Lei [address], [place]   [phone] Only accepts [place] Access [place] patients after they have their name applied to their card.   Self-Pay (no insurance) in [facility]:  Organization         Address  Phone   Notes  Sickle Cell Patients, [facility] [place] [phone]   [facility] Urgent Care [place] [phone]   [facility] [place]  [place], [place],  [phone]   [facility]/Dr. Osei-Bonsu  [address] [place] or [address], [place] [phone] Phone number for both [place] and [place] locations is the same.  Urgent Medical and [facility] [address], [place] [phone]   [facility] [address], [place] or [address] [phone]   [facility] [address], [place] [phone], phone; [phone], fax Sees patients 1st and 3rd Saturday of every month.  Must not qualify for public or private insurance (i.e. Medicaid, Medicare, [place] Health Choice, Veterans' Benefits)  Household income should be no more than 200% of the poverty level The clinic cannot treat you if you are pregnant or think you are pregnant  Sexually transmitted diseases are not treated at the clinic.    Dental Care: Organization         Address  Phone  Notes  [facility] Department of [facility] [place] [phone] Accepts children up to age 70 who are enrolled in [place] or [place]; pregnant  women with a Medicaid card; and children who have applied for Medicaid or  Health Choice, but were declined, whose parents can pay a reduced fee at time  of service.  [facility] of [facility]  [address], [place] [phone] Accepts children up to age 23 who are enrolled in [place] or [place]; pregnant women with a Medicaid card; and children who have applied for Medicaid or [place] Health Choice, but were declined, whose parents can pay a reduced fee at time of service.  [place] Adult [facility] Access PROGRAM  [place] [phone] Patients are seen by appointment only. Walk-ins are not accepted. [place] will see patients 81 years of age and older. Monday - Tuesday (8am-5pm) Most Wednesdays (8:30-5pm) $30 per visit, cash only  [facility] Adult Dental Access PROGRAM  [address], [facility] [phone] Patients are seen by appointment only. Walk-ins are not accepted. [place] will see patients 80 years of age and older. One Wednesday Evening (Monthly: Volunteer Based).  $30 per visit, cash only  [place]  [phone] for adults; Children under age 69, call Graduate Pediatric Dentistry at [phone]. Children aged 39-14, please call [phone] to request a pediatric application.  Dental services are provided in all areas of dental care including fillings, crowns and bridges, complete and partial dentures, implants, gum treatment, root canals, and extractions. Preventive care is also provided. Treatment is provided to both adults and children. Patients are selected via a lottery and there is often a waiting list.   [facility] [address], [place]  [phone] [email or web address]   [facility] [address] [place], [place] [phone], Ext. [phone] Second and Fourth Thursday of each month, opens at 6:30 AM; Clinic ends at 9 AM.  Patients are  seen on a first-come first-served basis, and a limited number are seen during each clinic.   [facility]  [address] [place]ard Danker [place], [place] [phone]   Eligibility Requirements You must have lived in [place], [place], or [place] counties for at least the last three months.   You cannot be eligible for state or federal sponsored [employer], including [employer], [place], or [employer].   You generally cannot be eligible for healthcare insurance through your employer.    How to apply: Eligibility screenings are held every Tuesday and Wednesday afternoon from 1:00 pm until 4:00 pm. You do not need an appointment for the interview!  [facility] [address] [place], [place]   [place]  [place] Department  [place]  [phone]    Behavioral Health Resources in the Community: Intensive Outpatient Programs Organization         Address  Phone  Notes  [place] Papillion. [address], [place], [place] [phone]   [facility] Outpatient [address], [place], [place]   ADS: [facility] Drug [facility] [address], [place], [place]   [place] [address]  [place], [place] or [phone]   Substance Abuse Resources Organization         Address  Phone  Notes  Alcohol and Drug Services  [phone]   [place]  [phone]   [place]  [phone]   Chinita Pester  [phone]   Residential & Outpatient Substance Abuse Program  [phone]   Psychological Services Organization         Address  Phone  Notes  [facility]  [place]   [place] [address], [place] or [phone]    Mobile Crisis  Teams Organization         Address  Phone  Notes  [facility], Mobile Crisis Care Unit  [phone]   [facility]  [address] [place], [place]   [place] Levels [address] [place] [phone]    Self-Help/Support Groups Organization         Address  Phone             Notes  [place]. of [place] - variety of support groups  [place] Call for more information  Narcotics Anonymous (NA), [facility]aring [facility] [address], [employer] [place]  2 meetings at this location   Special educational needs teacher         Address  Phone  Notes  ASAP Residential Treatment [place],    [place]  [phone]   [facility]  [address] [place] [phone], [place], [place]   [place], [place] [phone] Admissions: 8am-3pm M-F  Incentives Substance [place] [address]    [place], [place] [phone]   [facility] [address] [place], [place], [place]   [facility] [address]  [place], [place]   [facility] - Intensive Outpatient [address] Kristeen [address], [place], [place]   [facility] [place].,  [place], [place] [phone] or [phone]   [facility] Services (RTS) [address], [place], [place] Accepts Medicaid  [facility] [place] [address]  [place] [phone] Substance Abuse/Addiction Treatment   [facility] Organization         Address  Phone  Notes  [facility]  [phone]   Domenic Schwab, PhD [address] Arlis [place], [place]   [phone] or [phone]   [place], [place] [phone]   [facility] [address] [place], [place] [phone]  Insurance/Medicaid/sponsorship through [facility] and Families [address]                                    [place], [place] [phone] [place] [address], [place] [phone]    Dr. Adele Schilder  [phone]   [facility] Dept. 1) [address], [place] 2) [place] 3)  [place] [address], [place] [phone]  [phone]   [place] [phone] or [phone] (After Hours)

## 2015-02-27 MED FILL — Oxycodone w/ Acetaminophen Tab 5-325 MG: ORAL | Qty: 6 | Status: AC

## 2015-04-17 ENCOUNTER — Telehealth: Payer: Self-pay | Admitting: Obstetrics and Gynecology

## 2015-04-17 NOTE — Telephone Encounter (Signed)
Pt states that she has gotten her blood work back from [facility]. Pt was advised that she would need to make an appointment to be seen. Pt states that she wants Dr. Glo Herring to call her next week and wants to discuss the issues she is having with him. Pt refused to make an appointment and stated that Dr. Glo Herring knows her very well and that she would like to discuss this with him over the phone. I advised the pt that Dr. Glo Herring would not be back in the office until next we and she may not hear from him until then. Pt verbalized understanding.

## 2015-05-14 ENCOUNTER — Other Ambulatory Visit: Payer: Self-pay | Admitting: Obstetrics and Gynecology

## 2015-05-21 ENCOUNTER — Other Ambulatory Visit: Payer: Self-pay | Admitting: Obstetrics and Gynecology

## 2016-01-23 ENCOUNTER — Emergency Department (HOSPITAL_COMMUNITY)
Admission: EM | Admit: 2016-01-23 | Discharge: 2016-01-24 | Disposition: A | Discharge status: Home / Self Care | Payer: Medicare Other | Attending: Emergency Medicine | Admitting: Emergency Medicine

## 2016-01-23 ENCOUNTER — Encounter (HOSPITAL_COMMUNITY): Payer: Self-pay | Admitting: Emergency Medicine

## 2016-01-23 DIAGNOSIS — Z79899 Other long term (current) drug therapy: Secondary | ICD-10-CM | POA: Insufficient documentation

## 2016-01-23 DIAGNOSIS — Z7982 Long term (current) use of aspirin: Secondary | ICD-10-CM | POA: Diagnosis not present

## 2016-01-23 DIAGNOSIS — R51 Headache: Secondary | ICD-10-CM | POA: Insufficient documentation

## 2016-01-23 DIAGNOSIS — R519 Headache, unspecified: Secondary | ICD-10-CM

## 2016-01-23 DIAGNOSIS — F17210 Nicotine dependence, cigarettes, uncomplicated: Secondary | ICD-10-CM | POA: Insufficient documentation

## 2016-01-23 MED ORDER — MAGNESIUM SULFATE 2 GM/50ML IV SOLN
2.0000 g | Freq: Once | INTRAVENOUS | Status: AC
  Administered 2016-01-23: 2 g via INTRAVENOUS
  Filled 2016-01-23: qty 50

## 2016-01-23 MED ORDER — BUTALBITAL-APAP-CAFFEINE 50-325-40 MG PO TABS
1.0000 | ORAL_TABLET | Freq: Once | ORAL | Status: AC
  Administered 2016-01-23: 1 via ORAL
  Filled 2016-01-23: qty 1

## 2016-01-23 NOTE — ED Notes (Signed)
RN attempted IV access 2x. Will have another RN attempt.

## 2016-01-23 NOTE — ED Notes (Addendum)
Pt states since yesterday she has been having a constant headache with nausea. Pt also reports dizziness and blurry vision but but states she also has glaucoma so they blurry vision is constant.  Pt has history of benign pituitary brain tumor. pts pupils are round equal and reactive.

## 2016-01-24 ENCOUNTER — Emergency Department (HOSPITAL_COMMUNITY): Payer: Medicare Other

## 2016-01-24 NOTE — Discharge Instructions (Signed)
General Headache Without Cause Jenna Zamora, your CT scan results are below and did not show any cause for your headache.  Take tylenol at home for pain and see your primary care doctor within 3 days for close follow up.  If symptoms worsen, come back to the ED immediately. Thank you.   FINDINGS: Paranasal sinuses, mastoid air cells, bones, and extracranial soft tissues are normal. Cerebellum, brainstem, and basal cisterns are unremarkable. Ventricles and sulci are normal for age. No mass, mass effect, or midline shift. No acute cortical ischemia or infarct.  IMPRESSION: No acute abnormality.  A headache is pain or discomfort felt around the head or neck area. There are many causes and types of headaches. In some cases, the cause may not be found.  HOME CARE  Managing Pain  Take over-the-counter and prescription medicines only as told by your doctor.  Lie down in a dark, quiet room when you have a headache.  If directed, apply ice to the head and neck area:  Put ice in a plastic bag.  Place a towel between your skin and the bag.  Leave the ice on for 20 minutes, 2-3 times per day.  Use a heating pad or hot shower to apply heat to the head and neck area as told by your doctor.  Keep lights dim if bright lights bother you or make your headaches worse. Eating and Drinking  Eat meals on a regular schedule.  Lessen how much alcohol you drink.  Lessen how much caffeine you drink, or stop drinking caffeine. General Instructions  Keep all follow-up visits as told by your doctor. This is important.  Keep a journal to find out if certain things bring on headaches. For example, write down:  What you eat and drink.  How much sleep you get.  Any change to your diet or medicines.  Relax by getting a massage or doing other relaxing activities.  Lessen stress.  Sit up straight. Do not tighten (tense) your muscles.  Do not use tobacco products. This includes cigarettes, chewing  tobacco, or e-cigarettes. If you need help quitting, ask your doctor.  Exercise regularly as told by your doctor.  Get enough sleep. This often means 7-9 hours of sleep. GET HELP IF:  Your symptoms are not helped by medicine.  You have a headache that feels different than the other headaches.  You feel sick to your stomach (nauseous) or you throw up (vomit).  You have a fever. GET HELP RIGHT AWAY IF:   Your headache becomes really bad.  You keep throwing up.  You have a stiff neck.  You have trouble seeing.  You have trouble speaking.  You have pain in the eye or ear.  Your muscles are weak or you lose muscle control.  You lose your balance or have trouble walking.  You feel like you will pass out (faint) or you pass out.  You have confusion.   This information is not intended to replace advice given to you by your health care provider. Make sure you discuss any questions you have with your health care provider.   Document Released: 04/22/2008 Document Revised: 04/04/2015 Document Reviewed: 11/06/2014 Elsevier Interactive Patient Education [employer].

## 2016-01-30 NOTE — ED Provider Notes (Signed)
CSN: [id number]     Arrival date & time 01/23/16  2041 History   First MD Initiated Contact with Patient 01/23/16 2200     Chief Complaint  Patient presents with  . Headache     (Consider location/radiation/quality/duration/timing/severity/associated sxs/prior Treatment) Patient is a 45 y.o. female presenting with headaches. The history is provided by the patient.  Headache Pain location:  Generalized Quality:  Dull Radiates to:  Does not radiate Severity currently:  7/10 Severity at highest:  7/10 Onset quality:  Gradual Duration:  2 days Timing:  Constant Progression:  Worsening Chronicity:  New Similar to prior headaches: yes   Relieved by:  Nothing Worsened by:  Nothing Ineffective treatments:  None tried Associated symptoms: no congestion, no dizziness, no fever, no myalgias, no nausea and no vomiting    45 yo F With a chief complaint of a headache. This been going on for the past couple days. Feels different from prior headaches, but unable to quantify this. Patient denies fevers or chills denies head injury. Patient has had some blurred vision but does not feel like is different from her baseline.  Past Medical History  Diagnosis Date  . Arthritis   . DDD (degenerative disc disease)   . Bipolar 1 disorder ([place])   . Panic attacks   . Chronic back pain   . Anemia   . Blood transfusion without reported diagnosis   . Emphysema of lung ([place])   . GERD (gastroesophageal reflux disease)   . Hyperlipidemia   . PTSD (post-traumatic stress disorder)    Past Surgical History  Procedure Laterality Date  . Abdominal surgery    . Tubal ligation    . Back surgery    . Hip surgery     Family History  Problem Relation Age of Onset  . Cancer Mother     ovarian  . Heart disease Mother   . Cancer Maternal Aunt     breast  . Cancer Maternal Grandmother     ovarian   Social History  Substance Use Topics  . Smoking status: Current Every Day Smoker -- 1.00 packs/day   Types: Cigarettes    Start date: 07/28/2005  . Smokeless tobacco: Never Used  . Alcohol Use: Yes   OB History    No data available     Review of Systems  Constitutional: Negative for fever and chills.  HENT: Negative for congestion and rhinorrhea.   Eyes: Negative for redness and visual disturbance.  Respiratory: Negative for shortness of breath and wheezing.   Cardiovascular: Negative for chest pain and palpitations.  Gastrointestinal: Negative for nausea and vomiting.  Genitourinary: Negative for dysuria and urgency.  Musculoskeletal: Negative for myalgias and arthralgias.  Skin: Negative for pallor and wound.  Neurological: Positive for headaches. Negative for dizziness.      Allergies  Morphine and related and Codeine  Home Medications   Prior to Admission medications   Medication Sig Start Date End Date Taking? Authorizing Provider  ALPRAZolam (XANAX) 1 MG tablet TAKE ONE TABLET BY MOUTH TWICE DAILY AS NEEDED Patient taking differently: TAKE ONE TABLET (1 mg)  BY MOUTH three times a day 05/05/14  Yes Mary-Margaret Hassell Done, FNP  aspirin-acetaminophen-caffeine (EXCEDRIN MIGRAINE) [phone] MG tablet Take 2 tablets by mouth 2 (two) times daily as needed for headache.   Yes Historical Provider, MD  cyclobenzaprine (FLEXERIL) 10 MG tablet Take 10 mg by mouth 3 (three) times daily. 01/07/16  Yes Historical Provider, MD  Doxylamine Succinate, Sleep, (SLEEP AID PO)  Take 2 tablets by mouth at bedtime.   Yes Historical Provider, MD  ibuprofen (ADVIL,MOTRIN) 200 MG tablet Take 800 mg by mouth every 4 (four) hours as needed for moderate pain (for inflammation).   Yes Historical Provider, MD  Melatonin 10 MG TABS Take 20 mg by mouth at bedtime.   Yes Historical Provider, MD  temazepam (RESTORIL) 15 MG capsule Take 1 capsule (15 mg total) by mouth at bedtime as needed for sleep. 04/16/14  Yes Mary-Margaret Hassell Done, FNP  traZODone (DESYREL) 100 MG tablet Take 150 mg by mouth at bedtime.  12/25/15  Yes Historical Provider, MD   BP 116/73 mmHg  Pulse 81  Temp(Src) 98.5 F (36.9 C) (Oral)  Resp 18  Ht 5' (1.524 m)  Wt 153 lb 1 oz (69.429 kg)  BMI 29.89 kg/m2  SpO2 99% Physical Exam  Constitutional: She is oriented to person, place, and time. She appears well-developed and well-nourished. No distress.  HENT:  Head: Normocephalic and atraumatic.  Eyes: EOM are normal. Pupils are equal, round, and reactive to light.  Neck: Normal range of motion. Neck supple.  Cardiovascular: Normal rate and regular rhythm.  Exam reveals no gallop and no friction rub.   No murmur heard. Pulmonary/Chest: Effort normal. She has no wheezes. She has no rales.  Abdominal: Soft. She exhibits no distension. There is no tenderness.  Musculoskeletal: She exhibits no edema or tenderness.  Neurological: She is alert and oriented to person, place, and time. She has normal strength. No cranial nerve deficit or sensory deficit. She displays a negative Romberg sign. Coordination and gait normal. GCS eye subscore is 4. GCS verbal subscore is 5. GCS motor subscore is 6. She displays no Babinski's sign on the right side. She displays no Babinski's sign on the left side.  Reflex Scores:      Tricep reflexes are 2+ on the right side and 2+ on the left side.      Bicep reflexes are 2+ on the right side and 2+ on the left side.      Brachioradialis reflexes are 2+ on the right side and 2+ on the left side.      Patellar reflexes are 2+ on the right side and 2+ on the left side.      Achilles reflexes are 2+ on the right side and 2+ on the left side. Skin: Skin is warm and dry. She is not diaphoretic.  Psychiatric: She has a normal mood and affect. Her behavior is normal.  Nursing note and vitals reviewed.   ED Course  Procedures (including critical care time) Labs Review Labs Reviewed - No data to display  Imaging Review No results found. I have personally reviewed and evaluated these images and lab  results as part of my medical decision-making.   EKG Interpretation None      MDM   Final diagnoses:  Nonintractable episodic headache, unspecified headache type    45 yo F with a chief complaint of a headache. Patient has no focal neurologic symptoms. Hx of prior brain tumor will ct. Will treat with headache cocktail. Patient has no ride home give her IV magnesium. Patient with mild improvement of symptoms discharge home. CT head negative.    I have discussed the diagnosis/risks/treatment options with the patient and family and believe the pt to be eligible for discharge home to follow-up with PCP. We also discussed returning to the ED immediately if new or worsening sx occur. We discussed the sx which are most concerning (  e.g., sudden worsening pain, fever, inability to tolerate by mouth) that necessitate immediate return. Medications administered to the patient during their visit and any new prescriptions provided to the patient are listed below.  Medications given during this visit Medications  magnesium sulfate IVPB 2 g 50 mL (0 g Intravenous Stopped 01/23/16 2328)  butalbital-acetaminophen-caffeine (FIORICET, ESGIC) 50-325-40 MG per tablet 1 tablet (1 tablet Oral Given 01/23/16 2239)    Discharge Medication List as of 01/24/2016  1:09 AM      The patient appears reasonably screen and/or stabilized for discharge and I doubt any other medical condition or other [facility] requiring further screening, evaluation, or treatment in the ED at this time prior to discharge.      Deno Etienne, DO 01/30/16 1542

## 2016-12-12 ENCOUNTER — Emergency Department (HOSPITAL_COMMUNITY)
Admission: EM | Admit: 2016-12-12 | Discharge: 2016-12-12 | Disposition: A | Discharge status: Home / Self Care | Payer: Medicare Other | Attending: Emergency Medicine | Admitting: Emergency Medicine

## 2016-12-12 ENCOUNTER — Encounter (HOSPITAL_COMMUNITY): Payer: Self-pay | Admitting: *Deleted

## 2016-12-12 ENCOUNTER — Emergency Department (HOSPITAL_COMMUNITY): Payer: Medicare Other

## 2016-12-12 DIAGNOSIS — R0789 Other chest pain: Secondary | ICD-10-CM | POA: Insufficient documentation

## 2016-12-12 DIAGNOSIS — Y92410 Unspecified street and highway as the place of occurrence of the external cause: Secondary | ICD-10-CM | POA: Diagnosis not present

## 2016-12-12 DIAGNOSIS — Y939 Activity, unspecified: Secondary | ICD-10-CM | POA: Diagnosis not present

## 2016-12-12 DIAGNOSIS — S32010A Wedge compression fracture of first lumbar vertebra, initial encounter for closed fracture: Secondary | ICD-10-CM | POA: Diagnosis not present

## 2016-12-12 DIAGNOSIS — S20211A Contusion of right front wall of thorax, initial encounter: Secondary | ICD-10-CM | POA: Insufficient documentation

## 2016-12-12 DIAGNOSIS — F17210 Nicotine dependence, cigarettes, uncomplicated: Secondary | ICD-10-CM | POA: Insufficient documentation

## 2016-12-12 DIAGNOSIS — Y999 Unspecified external cause status: Secondary | ICD-10-CM | POA: Diagnosis not present

## 2016-12-12 DIAGNOSIS — S3992XA Unspecified injury of lower back, initial encounter: Secondary | ICD-10-CM | POA: Diagnosis present

## 2016-12-12 HISTORY — DX: Polyneuropathy, unspecified: G62.9

## 2016-12-12 HISTORY — DX: Benign neoplasm of pituitary gland: D35.2

## 2016-12-12 HISTORY — DX: Systemic involvement of connective tissue, unspecified: M35.9

## 2016-12-12 HISTORY — DX: Rheumatoid arthritis, unspecified: M06.9

## 2016-12-12 HISTORY — DX: Other intervertebral disc displacement, lumbar region: M51.26

## 2016-12-12 LAB — COMPREHENSIVE METABOLIC PANEL
ALT: 12 U/L — ABNORMAL LOW (ref 14–54)
AST: 19 U/L (ref 15–41)
Albumin: 3.9 g/dL (ref 3.5–5.0)
Alkaline Phosphatase: 75 U/L (ref 38–126)
Anion gap: 7 (ref 5–15)
BUN: 6 mg/dL (ref 6–20)
CO2: 25 mmol/L (ref 22–32)
Calcium: 9.1 mg/dL (ref 8.9–10.3)
Chloride: 109 mmol/L (ref 101–111)
Creatinine, Ser: 0.7 mg/dL (ref 0.44–1.00)
GFR calc Af Amer: >60 mL/min (ref >60)
GFR calc non Af Amer: >60 mL/min (ref >60)
Glucose, Bld: 100 mg/dL — ABNORMAL HIGH (ref 65–99)
POTASSIUM: 3.6 mmol/L (ref 3.5–5.1)
Sodium: 141 mmol/L (ref 135–145)
TOTAL PROTEIN: 6.7 g/dL (ref 6.5–8.1)
Total Bilirubin: 0.3 mg/dL (ref 0.3–1.2)

## 2016-12-12 LAB — CBC WITH DIFFERENTIAL/PLATELET
BASOS ABS: 0 10*3/uL (ref 0.0–0.1)
Basophils Relative: 1 %
EOS ABS: 0.1 10*3/uL (ref 0.0–0.7)
Eosinophils Relative: 2 %
HEMATOCRIT: 36.9 % (ref 36.0–46.0)
Hemoglobin: 11.9 g/dL — ABNORMAL LOW (ref 12.0–15.0)
Lymphocytes Relative: 31 %
Lymphs Abs: 2.3 10*3/uL (ref 0.7–4.0)
MCH: 30.8 pg (ref 26.0–34.0)
MCHC: 32.2 g/dL (ref 30.0–36.0)
MCV: 95.6 fL (ref 78.0–100.0)
Monocytes Absolute: 0.3 10*3/uL (ref 0.1–1.0)
Monocytes Relative: 4 %
Neutro Abs: 4.7 10*3/uL (ref 1.7–7.7)
Neutrophils Relative %: 62 %
Platelets: 199 10*3/uL (ref 150–400)
RBC: 3.86 MIL/uL — ABNORMAL LOW (ref 3.87–5.11)
RDW: 13.1 % (ref 11.5–15.5)
WBC: 7.5 10*3/uL (ref 4.0–10.5)

## 2016-12-12 LAB — I-STAT BETA HCG BLOOD, ED (MC, WL, AP ONLY)

## 2016-12-12 LAB — LIPASE, BLOOD: Lipase: 19 U/L (ref 11–51)

## 2016-12-12 MED ORDER — OXYCODONE-ACETAMINOPHEN 5-325 MG PO TABS: 1.0000 | ORAL_TABLET | Freq: Four times a day (QID) | ORAL | 0 refills | Status: DC | PRN

## 2016-12-12 MED ORDER — HYDROMORPHONE HCL 1 MG/ML IJ SOLN
1.0000 mg | Freq: Once | INTRAMUSCULAR | Status: AC
Start: 2016-12-12 — End: 2016-12-12
  Administered 2016-12-12: 1 mg via INTRAVENOUS
  Filled 2016-12-12: qty 1

## 2016-12-12 MED ORDER — IBUPROFEN 800 MG PO TABS
800.0000 mg | ORAL_TABLET | Freq: Once | ORAL | Status: AC
  Administered 2016-12-12: 800 mg via ORAL
  Filled 2016-12-12: qty 1

## 2016-12-12 MED ORDER — HYDROMORPHONE HCL 1 MG/ML IJ SOLN
1.0000 mg | Freq: Once | INTRAMUSCULAR | Status: AC
  Administered 2016-12-12: 1 mg via INTRAVENOUS
  Filled 2016-12-12: qty 1

## 2016-12-12 MED ORDER — CYCLOBENZAPRINE HCL 10 MG PO TABS: 10.0000 mg | ORAL_TABLET | Freq: Two times a day (BID) | ORAL | 0 refills | Status: DC | PRN

## 2016-12-12 MED ORDER — IOPAMIDOL (ISOVUE-300) INJECTION 61%
100.0000 mL | Freq: Once | INTRAVENOUS | Status: AC | PRN
  Administered 2016-12-12: 100 mL via INTRAVENOUS

## 2016-12-12 NOTE — Discharge Instructions (Signed)
Please follow-up with Dr. Annette Stable regarding your back injury. We do not have a back brace for you here today, and your neurosurgeon's office many be able to provide you with one. Please return without fail for worsening symptoms, including difficulty walking, escalating pain or any other symptoms concerning to you.
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
## 2016-12-19 ENCOUNTER — Encounter (HOSPITAL_COMMUNITY): Payer: Self-pay | Admitting: Emergency Medicine

## 2016-12-19 ENCOUNTER — Emergency Department (HOSPITAL_COMMUNITY)
Admission: EM | Admit: 2016-12-19 | Discharge: 2016-12-19 | Disposition: A | Discharge status: Home / Self Care | Payer: Medicare Other | Attending: Emergency Medicine | Admitting: Emergency Medicine

## 2016-12-19 DIAGNOSIS — Z79899 Other long term (current) drug therapy: Secondary | ICD-10-CM | POA: Insufficient documentation

## 2016-12-19 DIAGNOSIS — F17210 Nicotine dependence, cigarettes, uncomplicated: Secondary | ICD-10-CM | POA: Insufficient documentation

## 2016-12-19 DIAGNOSIS — S20211D Contusion of right front wall of thorax, subsequent encounter: Secondary | ICD-10-CM | POA: Diagnosis not present

## 2016-12-19 DIAGNOSIS — S299XXD Unspecified injury of thorax, subsequent encounter: Secondary | ICD-10-CM | POA: Diagnosis present

## 2016-12-19 LAB — BASIC METABOLIC PANEL
ANION GAP: 9 (ref 5–15)
BUN: 6 mg/dL (ref 6–20)
CO2: 25 mmol/L (ref 22–32)
Calcium: 9.1 mg/dL (ref 8.9–10.3)
Chloride: 105 mmol/L (ref 101–111)
Creatinine, Ser: 0.81 mg/dL (ref 0.44–1.00)
GFR calc Af Amer: >60 mL/min (ref >60)
GLUCOSE: 159 mg/dL — AB (ref 65–99)
POTASSIUM: 3.6 mmol/L (ref 3.5–5.1)
Sodium: 139 mmol/L (ref 135–145)

## 2016-12-19 LAB — CBC
HEMATOCRIT: 38.6 % (ref 36.0–46.0)
HEMOGLOBIN: 12.3 g/dL (ref 12.0–15.0)
MCH: 30.5 pg (ref 26.0–34.0)
MCHC: 31.9 g/dL (ref 30.0–36.0)
MCV: 95.8 fL (ref 78.0–100.0)
Platelets: 207 10*3/uL (ref 150–400)
RBC: 4.03 MIL/uL (ref 3.87–5.11)
RDW: 13.2 % (ref 11.5–15.5)
WBC: 6.6 10*3/uL (ref 4.0–10.5)

## 2016-12-19 LAB — I-STAT TROPONIN, ED: Troponin i, poc: 0 ng/mL (ref 0.00–0.08)

## 2016-12-19 MED ORDER — DIAZEPAM 5 MG PO TABS
5.0000 mg | ORAL_TABLET | Freq: Once | ORAL | Status: AC
  Administered 2016-12-19: 5 mg via ORAL
  Filled 2016-12-19: qty 1

## 2016-12-19 MED ORDER — KETOROLAC TROMETHAMINE 60 MG/2ML IM SOLN
30.0000 mg | Freq: Once | INTRAMUSCULAR | Status: AC
  Administered 2016-12-19: 30 mg via INTRAMUSCULAR
  Filled 2016-12-19: qty 2
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
## 2016-12-19 NOTE — ED Notes (Signed)
Patient walking between rooms to daughters room looking for her cell phone.  States she left it in the waiting room and wants someone to go get it for her.
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
## 2017-03-06 ENCOUNTER — Other Ambulatory Visit (HOSPITAL_COMMUNITY): Payer: Self-pay | Admitting: Neurosurgery

## 2017-03-06 ENCOUNTER — Ambulatory Visit (HOSPITAL_COMMUNITY)
Admission: RE | Admit: 2017-03-06 | Discharge: 2017-03-06 | Disposition: A | Discharge status: Home / Self Care | Payer: Medicare Other | Source: Ambulatory Visit | Attending: Vascular Surgery | Admitting: Vascular Surgery

## 2017-03-06 DIAGNOSIS — M7989 Other specified soft tissue disorders: Secondary | ICD-10-CM

## 2017-04-29 ENCOUNTER — Ambulatory Visit: Payer: Self-pay | Admitting: Obstetrics and Gynecology

## 2018-05-31 ENCOUNTER — Encounter (HOSPITAL_COMMUNITY): Payer: Self-pay | Admitting: Emergency Medicine

## 2018-05-31 ENCOUNTER — Emergency Department (HOSPITAL_COMMUNITY): Payer: Medicare Other

## 2018-05-31 ENCOUNTER — Emergency Department (HOSPITAL_COMMUNITY)
Admission: EM | Admit: 2018-05-31 | Discharge: 2018-05-31 | Disposition: A | Discharge status: Home / Self Care | Payer: Medicare Other | Attending: Emergency Medicine | Admitting: Emergency Medicine

## 2018-05-31 ENCOUNTER — Other Ambulatory Visit: Payer: Self-pay

## 2018-05-31 DIAGNOSIS — W19XXXA Unspecified fall, initial encounter: Secondary | ICD-10-CM

## 2018-05-31 DIAGNOSIS — M545 Low back pain, unspecified: Secondary | ICD-10-CM

## 2018-05-31 DIAGNOSIS — Z79899 Other long term (current) drug therapy: Secondary | ICD-10-CM | POA: Insufficient documentation

## 2018-05-31 DIAGNOSIS — W108XXA Fall (on) (from) other stairs and steps, initial encounter: Secondary | ICD-10-CM | POA: Insufficient documentation

## 2018-05-31 DIAGNOSIS — M069 Rheumatoid arthritis, unspecified: Secondary | ICD-10-CM | POA: Insufficient documentation

## 2018-05-31 DIAGNOSIS — F17210 Nicotine dependence, cigarettes, uncomplicated: Secondary | ICD-10-CM | POA: Diagnosis not present

## 2018-05-31 MED ORDER — OXYCODONE-ACETAMINOPHEN 5-325 MG PO TABS
2.0000 | ORAL_TABLET | Freq: Once | ORAL | Status: AC
  Administered 2018-05-31: 2 via ORAL
  Filled 2018-05-31: qty 2

## 2018-05-31 MED ORDER — IBUPROFEN 600 MG PO TABS: 600.0000 mg | ORAL_TABLET | Freq: Four times a day (QID) | ORAL | 0 refills | Status: DC | PRN

## 2018-05-31 MED ORDER — OXYCODONE-ACETAMINOPHEN 5-325 MG PO TABS: 1.0000 | ORAL_TABLET | Freq: Four times a day (QID) | ORAL | 0 refills | Status: DC | PRN

## 2018-05-31 MED ORDER — CYCLOBENZAPRINE HCL 10 MG PO TABS: 10.0000 mg | ORAL_TABLET | Freq: Two times a day (BID) | ORAL | 0 refills | Status: DC | PRN

## 2018-05-31 NOTE — ED Provider Notes (Signed)
[facility] EMERGENCY DEPARTMENT Provider Note   CSN: [id number] Arrival date & time: 05/31/18  1431     History   Chief Complaint Chief Complaint  Patient presents with  . Fall    HPI Jenna Zamora is a 47 y.o. female with history of bipolar 1 disorder, emphysema, chronic back pain who presents with right low back pain and hip pain after fall down the stairs last night.  Patient reports she was trying to step over a baby gate when her foot caught.  She did not hit her head or lose consciousness.  She reports falling onto her buttocks and since slipping down 7 stairs on her buttocks.  She also hit her right hand.  She has history of surgery to her neck and upper back, however denies pain there.  She has decreased sensation to her right thigh.  She denies any saddle anesthesia, loss of bowel or bladder control, fevers, history of lumbar surgery or IVDU.  Patient does have history of lumbar disc herniation.  HPI  Past Medical History:  Diagnosis Date  . Anemia   . Arthritis   . Bipolar 1 disorder ([place])   . Blood transfusion without reported diagnosis   . Chronic back pain   . Collagen vascular disease ([place])   . DDD (degenerative disc disease)   . Emphysema of lung ([place])   . GERD (gastroesophageal reflux disease)   . Hyperlipidemia   . Lumbar herniated disc   . Neuropathy   . Panic attacks   . Pituitary adenoma ([place])   . PTSD (post-traumatic stress disorder)   . Rheumatoid arthritis [facility])     Patient Active Problem List   Diagnosis Date Noted  . Herniated vertebral disc 04/20/2013  . DDD (degenerative disc disease) 04/20/2013  . Rheumatoid arthritis of hand ([place]) 04/20/2013  . PTSD (post-traumatic stress disorder) 04/20/2013  . 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 Insomnia 04/20/2013  . GAD (generalized anxiety disorder) 04/20/2013  . Chronic pain syndrome 04/20/2013  . Incontinence 04/20/2013    Past Surgical History:  Procedure Laterality Date  . ABDOMINAL SURGERY     . HIP SURGERY    . NECK SURGERY    . TUBAL LIGATION       OB History    Gravida  4   Para  3   Term  3   Preterm      AB  1   Living        SAB  1   TAB      Ectopic      Multiple      Live Births               Home Medications    Prior to Admission medications   Medication Sig Start Date End Date Taking? Authorizing Provider  ALPRAZolam (XANAX) 1 MG tablet TAKE ONE TABLET BY MOUTH TWICE DAILY AS NEEDED Patient taking differently: TAKE ONE TABLET (1 mg)  BY MOUTH three times a day 05/05/14   Hassell Done, Mary-Margaret, FNP  cyclobenzaprine (FLEXERIL) 10 MG tablet Take 1 tablet (10 mg total) by mouth 2 (two) times daily as needed for muscle spasms. 05/31/18   Harbor Paster, Bea Graff, PA-C  Doxylamine Succinate, Sleep, (SLEEP AID PO) Take 2 tablets by mouth at bedtime.    [provider]  ferrous sulfate 325 (65 FE) MG tablet Take 325 mg by mouth daily with breakfast.    [provider]  ibuprofen (ADVIL,MOTRIN) 600 MG tablet Take 1 tablet (  600 mg total) by mouth every 6 (six) hours as needed. 05/31/18   Sollie Vultaggio, Bea Graff, PA-C  Melatonin 10 MG TABS Take 20 mg by mouth at bedtime.    [provider]  oxyCODONE-acetaminophen (PERCOCET/ROXICET) 5-325 MG tablet Take 1-2 tablets by mouth every 6 (six) hours as needed for severe pain. 05/31/18   Akylah Hascall, Bea Graff, PA-C  traZODone (DESYREL) 100 MG tablet Take 150 mg by mouth at bedtime. 12/25/15   [provider]    Family History Family History  Problem Relation Age of Onset  . Cancer Mother        ovarian  . Heart disease Mother   . Cancer Maternal Aunt        breast  . Cancer Maternal Grandmother        ovarian    Social History Social History   Tobacco Use  . Smoking status: Current Every Day Smoker    Packs/day: 1.00    Types: Cigarettes    Start date: 07/28/2005  . Smokeless tobacco: Never Used  Substance Use Topics  . Alcohol use: No  . Drug use: No     Allergies     Morphine and related; Codeine; and Tramadol   Review of Systems Review of Systems  Constitutional: Negative for chills and fever.  HENT: Negative for facial swelling and sore throat.   Respiratory: Negative for shortness of breath.   Cardiovascular: Negative for chest pain.  Gastrointestinal: Negative for abdominal pain, nausea and vomiting.  Genitourinary: Negative for difficulty urinating and dysuria.  Musculoskeletal: Positive for back pain.  Skin: Negative for rash and wound.  Neurological: Positive for numbness (decreased sensation). Negative for headaches.  Psychiatric/Behavioral: The patient is not nervous/anxious.      Physical Exam Updated Vital Signs BP (!) 159/88 (BP Location: Right Arm)   Pulse 87   Temp 98.2 F (36.8 C) (Oral)   Ht 5' (1.524 m)   Wt 59 kg   SpO2 99%   BMI 25.39 kg/m   Physical Exam  Constitutional: She appears well-developed and well-nourished. No distress.  HENT:  Head: Normocephalic and atraumatic.  Mouth/Throat: Oropharynx is clear and moist. No oropharyngeal exudate.  Eyes: Pupils are equal, round, and reactive to light. Conjunctivae are normal. Right eye exhibits no discharge. Left eye exhibits no discharge. No scleral icterus.  Neck: Normal range of motion. Neck supple. No thyromegaly present.  Cardiovascular: Normal rate, regular rhythm, normal heart sounds and intact distal pulses. Exam reveals no gallop and no friction rub.  No murmur heard. Pulmonary/Chest: Effort normal and breath sounds normal. No stridor. No respiratory distress. She has no wheezes. She has no rales.  Abdominal: Soft. Bowel sounds are normal. She exhibits no distension. There is no tenderness. There is no rebound and no guarding.  Musculoskeletal: She exhibits no edema.       Back:       Legs: No midline cervical, thoracic or lumbar tenderness, but tenderness to the right of the lumbar spine and to the right glute Tenderness to the fourth and fifth metacarpals  of the right hand; no ecchymosis or deformity noted; full range of motion; no pain elsewhere on palpation of the hand or wrist, no anatomical snuffbox tenderness  Lymphadenopathy:    She has no cervical adenopathy.  Neurological: She is alert. Coordination normal.  5/5 strength to bilateral lower extremities, normal sensation to all areas except area noted with decreased sensation in the anterior thigh, but sensation is intact; 2+ patellar reflexes  Skin: Skin is warm and dry. No rash noted. She is not diaphoretic. No pallor.  Psychiatric: She has a normal mood and affect.  Nursing note and vitals reviewed.    ED Treatments / Results  Labs (all labs ordered are listed, but only abnormal results are displayed) Labs Reviewed - No data to display  EKG None  Radiology Dg Lumbar Spine Complete  Result Date: 05/31/2018 CLINICAL DATA:  Acute lower back pain after fall. EXAM: LUMBAR SPINE - COMPLETE 4+ VIEW COMPARISON:  Radiographs of April 03, 2017. FINDINGS: Stable old L1 compression fracture. No acute fracture or spondylolisthesis is noted. Disc spaces are well-maintained. Posterior facet joints are unremarkable. IMPRESSION: Old L1 compression fracture. No acute abnormality seen in the lumbar spine. Electronically Signed   By: Marijo Conception, M.D.   On: 05/31/2018 16:09   Dg Sacrum/coccyx  Result Date: 05/31/2018 CLINICAL DATA:  Acute tail bone pain after fall. EXAM: SACRUM AND COCCYX - 2+ VIEW COMPARISON:  None. FINDINGS: There is no evidence of fracture or other focal bone lesions. IMPRESSION: Negative. Electronically Signed   By: Marijo Conception, M.D.   On: 05/31/2018 16:11   Ct Lumbar Spine Wo Contrast  Result Date: 05/31/2018 CLINICAL DATA:  Fall last night.  History of compression fracture EXAM: CT LUMBAR SPINE WITHOUT CONTRAST TECHNIQUE: Multidetector CT imaging of the lumbar spine was performed without intravenous contrast administration. Multiplanar CT image reconstructions were  also generated. COMPARISON:  Radiography from earlier today. Abdominal CT 12/12/2016 FINDINGS: Segmentation: 5 lumbar type vertebral bodies. Alignment: Normal Vertebrae: Remote L1 superior endplate fracture. Chronic mild undulation of the L5 superior endplate. No acute fracture. No evidence of bone lesion or discitis. Paraspinal and other soft tissues: Negative Disc levels: L4-5 and L5-S1 mild disc bulging. IMPRESSION: No acute finding. Remote L1 superior endplate fracture. Electronically Signed   By: Monte Fantasia M.D.   On: 05/31/2018 16:19   Dg Hand Complete Right  Result Date: 05/31/2018 CLINICAL DATA:  Right hand pain after fall. EXAM: RIGHT HAND - COMPLETE 3+ VIEW COMPARISON:  None. FINDINGS: There is no evidence of fracture or dislocation. There is no evidence of arthropathy or other focal bone abnormality. Soft tissues are unremarkable. IMPRESSION: Negative. Electronically Signed   By: Marijo Conception, M.D.   On: 05/31/2018 16:13   Dg Hip Unilat W Or Wo Pelvis 2-3 Views Right  Result Date: 05/31/2018 CLINICAL DATA:  Right hip pain after fall. EXAM: DG HIP (WITH OR WITHOUT PELVIS) 2-3V RIGHT COMPARISON:  None. FINDINGS: There is no evidence of hip fracture or dislocation. There is no evidence of arthropathy or other focal bone abnormality. IMPRESSION: Negative. Electronically Signed   By: Marijo Conception, M.D.   On: 05/31/2018 16:12    Procedures Procedures (including critical care time)  Medications Ordered in ED Medications  oxyCODONE-acetaminophen (PERCOCET/ROXICET) 5-325 MG per tablet 2 tablet (2 tablets Oral Given 05/31/18 1544)     Initial Impression / Assessment and Plan / ED Course  I have reviewed the triage vital signs and the nursing notes.  Pertinent labs & imaging results that were available during my care of the patient were reviewed by me and considered in my medical decision making (see chart for details).     Patient with chronic back pain presenting with radicular  back pain after fall last evening.  X-rays are negative for acute findings except for old compression fracture.  CT lumbar completed to assess extensive disc injury.  Persistent L4-5 and  L5-1 mild disc bulging seen.  Patient feeling better after 2 Percocet in the ED.  Patient is neurovascularly intact.  Patient has no saddle anesthesia, bowel or bladder incontinence.  Supportive treatment discussed including ice.  Will discharge home with short course of Percocet, ibuprofen, and Flexeril for pain control.  I reviewed the [place] narcotic database and found no discrepancies.  Patient to follow-up with her PCP if symptoms are not improving.  Patient understands and agrees with plan.  Patient vitals stable throughout ED course and discharged in satisfactory condition.  I discussed patient case with my attending, Dr. Thurnell Garbe, who guided the patient's management and agrees with plan.   Final Clinical Impressions(s) / ED Diagnoses   Final diagnoses:  Fall, initial encounter  Acute right-sided low back pain without sciatica    ED Discharge Orders         Ordered    oxyCODONE-acetaminophen (PERCOCET/ROXICET) 5-325 MG tablet  Every 6 hours PRN     05/31/18 1639    cyclobenzaprine (FLEXERIL) 10 MG tablet  2 times daily PRN     05/31/18 1639    ibuprofen (ADVIL,MOTRIN) 600 MG tablet  Every 6 hours PRN     05/31/18 1639           Salomon Ganser, Bea Graff, PA-C 05/31/18 [place], [place], DO 06/01/18 1529

## 2018-05-31 NOTE — ED Triage Notes (Signed)
Patient reports she fell down a flight of stairs last night. C/O back and leg pain. Patient ambulatory to Triage.

## 2018-05-31 NOTE — Discharge Instructions (Signed)
Take ibuprofen every 6 hours as prescribed for your pain.  For breakthrough pain, take 1-2 Percocet every 6 hours as needed.  Take Flexeril twice daily as for muscle pain or spasms.  Use ice 3-4 times daily alternating 20 minutes on, 20 minutes off.  Please follow-up with your doctor if your symptoms are not improving.  Please return to the emergency department if you develop any new or worsening symptoms including complete numbness of your leg or groin, loss of bowel or bladder control, or any other concerning symptom.  Do not drink alcohol, drive, operate machinery or participate in any other potentially dangerous activities while taking opiate pain medication as it may make you sleepy. Do not take this medication with any other sedating medications, either prescription or over-the-counter. If you were prescribed Percocet or Vicodin, do not take these with acetaminophen (Tylenol) as it is already contained within these medications and overdose of Tylenol is dangerous.   This medication is an opiate (or narcotic) pain medication and can be habit forming.  Use it as little as possible to achieve adequate pain control.  Do not use or use it with extreme caution if you have a history of opiate abuse or dependence. This medication is intended for your use only - do not give any to anyone else and keep it in a secure place where nobody else, especially children, have access to it. It will also cause or worsen constipation, so you may want to consider taking an over-the-counter stool softener while you are taking this medication.

## 2018-06-13 ENCOUNTER — Other Ambulatory Visit: Payer: Self-pay

## 2018-06-13 ENCOUNTER — Emergency Department (HOSPITAL_COMMUNITY)
Admission: EM | Admit: 2018-06-13 | Discharge: 2018-06-13 | Disposition: A | Discharge status: Home / Self Care | Payer: Medicare Other | Attending: Emergency Medicine | Admitting: Emergency Medicine

## 2018-06-13 ENCOUNTER — Encounter (HOSPITAL_COMMUNITY): Payer: Self-pay

## 2018-06-13 DIAGNOSIS — Z79899 Other long term (current) drug therapy: Secondary | ICD-10-CM | POA: Diagnosis not present

## 2018-06-13 DIAGNOSIS — F17210 Nicotine dependence, cigarettes, uncomplicated: Secondary | ICD-10-CM | POA: Insufficient documentation

## 2018-06-13 DIAGNOSIS — R112 Nausea with vomiting, unspecified: Secondary | ICD-10-CM | POA: Diagnosis present

## 2018-06-13 DIAGNOSIS — R197 Diarrhea, unspecified: Secondary | ICD-10-CM | POA: Diagnosis not present

## 2018-06-13 DIAGNOSIS — E785 Hyperlipidemia, unspecified: Secondary | ICD-10-CM | POA: Diagnosis not present

## 2018-06-13 DIAGNOSIS — E876 Hypokalemia: Secondary | ICD-10-CM

## 2018-06-13 LAB — URINALYSIS, ROUTINE W REFLEX MICROSCOPIC
BILIRUBIN URINE: NEGATIVE
GLUCOSE, UA: NEGATIVE mg/dL
HGB URINE DIPSTICK: NEGATIVE
Ketones, ur: NEGATIVE mg/dL
Leukocytes, UA: NEGATIVE
Nitrite: NEGATIVE
PH: 7 (ref 5.0–8.0)
Protein, ur: NEGATIVE mg/dL
SPECIFIC GRAVITY, URINE: 1.011 (ref 1.005–1.030)

## 2018-06-13 LAB — CBC
HEMATOCRIT: 40.6 % (ref 36.0–46.0)
HEMOGLOBIN: 13 g/dL (ref 12.0–15.0)
MCH: 31 pg (ref 26.0–34.0)
MCHC: 32 g/dL (ref 30.0–36.0)
MCV: 96.9 fL (ref 80.0–100.0)
Platelets: 194 10*3/uL (ref 150–400)
RBC: 4.19 MIL/uL (ref 3.87–5.11)
RDW: 12.2 % (ref 11.5–15.5)
WBC: 8.8 10*3/uL (ref 4.0–10.5)
nRBC: 0 % (ref 0.0–0.2)

## 2018-06-13 LAB — COMPREHENSIVE METABOLIC PANEL
ALBUMIN: 4.2 g/dL (ref 3.5–5.0)
ALT: 15 U/L (ref 0–44)
AST: 20 U/L (ref 15–41)
Alkaline Phosphatase: 67 U/L (ref 38–126)
Anion gap: 10 (ref 5–15)
BUN: 16 mg/dL (ref 6–20)
CHLORIDE: 104 mmol/L (ref 98–111)
CO2: 21 mmol/L — AB (ref 22–32)
Calcium: 8.9 mg/dL (ref 8.9–10.3)
Creatinine, Ser: 0.59 mg/dL (ref 0.44–1.00)
GFR calc Af Amer: >60 mL/min (ref >60)
GFR calc non Af Amer: >60 mL/min (ref >60)
GLUCOSE: 107 mg/dL — AB (ref 70–99)
POTASSIUM: 2.9 mmol/L — AB (ref 3.5–5.1)
Sodium: 135 mmol/L (ref 135–145)
Total Bilirubin: 0.9 mg/dL (ref 0.3–1.2)
Total Protein: 7.5 g/dL (ref 6.5–8.1)

## 2018-06-13 LAB — LIPASE, BLOOD: LIPASE: 33 U/L (ref 11–51)

## 2018-06-13 MED ORDER — SODIUM CHLORIDE 0.9 % IV BOLUS
1000.0000 mL | Freq: Once | INTRAVENOUS | Status: AC
  Administered 2018-06-13: 1000 mL via INTRAVENOUS

## 2018-06-13 MED ORDER — ONDANSETRON HCL 4 MG/2ML IJ SOLN
4.0000 mg | Freq: Once | INTRAMUSCULAR | Status: AC
  Administered 2018-06-13: 4 mg via INTRAVENOUS
  Filled 2018-06-13: qty 2

## 2018-06-13 MED ORDER — POTASSIUM CHLORIDE CRYS ER 20 MEQ PO TBCR
40.0000 meq | EXTENDED_RELEASE_TABLET | Freq: Once | ORAL | Status: AC
  Administered 2018-06-13: 40 meq via ORAL
  Filled 2018-06-13: qty 2

## 2018-06-13 MED ORDER — ONDANSETRON HCL 4 MG PO TABS: 4.0000 mg | ORAL_TABLET | Freq: Four times a day (QID) | ORAL | 0 refills | Status: DC

## 2018-06-13 MED ORDER — POTASSIUM CHLORIDE CRYS ER 20 MEQ PO TBCR: 20.0000 meq | EXTENDED_RELEASE_TABLET | Freq: Two times a day (BID) | ORAL | 0 refills | Status: DC

## 2018-06-13 NOTE — ED Triage Notes (Signed)
Pt reports she woke up at 3 am with vomiting and diarrhea.

## 2018-06-13 NOTE — ED Provider Notes (Signed)
[facility] EMERGENCY DEPARTMENT Provider Note   CSN: [id number] Arrival date & time: 06/13/18  1545     History   Chief Complaint Chief Complaint  Patient presents with  . Emesis    HPI Jenna Zamora is a 47 y.o. female.  HPI   Jenna Zamora is a 47 y.o. female who presents to the Emergency Department complaining of sudden onset of vomiting this morning around 3 am.  She reports multiple episodes of vomiting and several loose watery stools as well.  Her daughter and grandchild also have similar symptoms and presented here for evaluation.  She states that she has been unable to tolerate food today, but has drank a few small sips of water without further vomiting.  No abdomen pain, fever, chills, shortness of breath or chest pain.  No new medications, recent travel or antibiotics.     Past Medical History:  Diagnosis Date  . Anemia   . Arthritis   . Bipolar 1 disorder ([place])   . Blood transfusion without reported diagnosis   . Chronic back pain   . Collagen vascular disease ([place])   . DDD (degenerative disc disease)   . Emphysema of lung ([place])   . GERD (gastroesophageal reflux disease)   . Hyperlipidemia   . Lumbar herniated disc   . Neuropathy   . Panic attacks   . Pituitary adenoma ([place])   . PTSD (post-traumatic stress disorder)   . Rheumatoid arthritis [facility])     Patient Active Problem List   Diagnosis Date Noted  . Herniated vertebral disc 04/20/2013  . DDD (degenerative disc disease) 04/20/2013  . Rheumatoid arthritis of hand ([place]) 04/20/2013  . PTSD (post-traumatic stress disorder) 04/20/2013  . 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 Insomnia 04/20/2013  . GAD (generalized anxiety disorder) 04/20/2013  . Chronic pain syndrome 04/20/2013  . Incontinence 04/20/2013    Past Surgical History:  Procedure Laterality Date  . ABDOMINAL SURGERY    . DENTAL SURGERY    . HIP SURGERY    . NECK SURGERY    . TUBAL LIGATION       OB History    Gravida  4   Para   3   Term  3   Preterm      AB  1   Living        SAB  1   TAB      Ectopic      Multiple      Live Births               Home Medications    Prior to Admission medications   Medication Sig Start Date End Date Taking? Authorizing Provider  ALPRAZolam (XANAX) 1 MG tablet TAKE ONE TABLET BY MOUTH TWICE DAILY AS NEEDED Patient taking differently: TAKE ONE TABLET (1 mg)  BY MOUTH three times a day 05/05/14   Hassell Done, Mary-Margaret, FNP  cyclobenzaprine (FLEXERIL) 10 MG tablet Take 1 tablet (10 mg total) by mouth 2 (two) times daily as needed for muscle spasms. 05/31/18   Law, Bea Graff, PA-C  Doxylamine Succinate, Sleep, (SLEEP AID PO) Take 2 tablets by mouth at bedtime.    [provider]  ferrous sulfate 325 (65 FE) MG tablet Take 325 mg by mouth daily with breakfast.    [provider]  ibuprofen (ADVIL,MOTRIN) 600 MG tablet Take 1 tablet (600 mg total) by mouth every 6 (six) hours as needed. 05/31/18   Frederica Kuster, PA-C  Melatonin 10  MG TABS Take 20 mg by mouth at bedtime.    [provider]  oxyCODONE-acetaminophen (PERCOCET/ROXICET) 5-325 MG tablet Take 1-2 tablets by mouth every 6 (six) hours as needed for severe pain. 05/31/18   Law, Bea Graff, PA-C  traZODone (DESYREL) 100 MG tablet Take 150 mg by mouth at bedtime. 12/25/15   [provider]    Family History Family History  Problem Relation Age of Onset  . Cancer Mother        ovarian  . Heart disease Mother   . Cancer Maternal Aunt        breast  . Cancer Maternal Grandmother        ovarian    Social History Social History   Tobacco Use  . Smoking status: Current Every Day Smoker    Packs/day: 1.00    Types: Cigarettes    Start date: 07/28/2005  . Smokeless tobacco: Never Used  Substance Use Topics  . Alcohol use: No  . Drug use: No     Allergies   Morphine and related; Codeine; and Tramadol   Review of Systems Review of Systems  Constitutional:  Positive for appetite change. Negative for activity change, chills and fever.  HENT: Negative for rhinorrhea, sore throat and trouble swallowing.   Respiratory: Negative for cough, chest tightness and shortness of breath.   Cardiovascular: Negative for chest pain.  Gastrointestinal: Positive for diarrhea, nausea and vomiting. Negative for abdominal distention, abdominal pain and constipation.  Genitourinary: Negative for dysuria.  Musculoskeletal: Negative for arthralgias, back pain and neck pain.  Neurological: Negative for weakness and numbness.  Psychiatric/Behavioral: Negative for confusion.     Physical Exam Updated Vital Signs BP 103/80 (BP Location: Right Arm)   Pulse (!) 115   Temp 98.4 F (36.9 C) (Oral)   Resp 20   Wt 61.2 kg   BMI 26.37 kg/m   Physical Exam  Constitutional: She is oriented to person, place, and time. She appears well-developed and well-nourished.  HENT:  Right Ear: Tympanic membrane and ear canal normal.  Left Ear: Tympanic membrane and ear canal normal.  Mouth/Throat: Uvula is midline and mucous membranes are normal. No oral lesions. No oropharyngeal exudate, posterior oropharyngeal edema or posterior oropharyngeal erythema.  Mucous membranes are dry  Neck: Normal range of motion.  Cardiovascular: Normal rate and regular rhythm.  Pulmonary/Chest: Effort normal and breath sounds normal. No respiratory distress.  Abdominal: Soft. She exhibits no distension. There is no tenderness. There is no guarding.  Musculoskeletal: Normal range of motion.  Neurological: She is alert and oriented to person, place, and time. No sensory deficit.  Skin: Skin is warm. Capillary refill takes 2 to 3 seconds. No rash noted.  Psychiatric: She has a normal mood and affect.  Nursing note and vitals reviewed.    ED Treatments / Results  Labs (all labs ordered are listed, but only abnormal results are displayed) Labs Reviewed  COMPREHENSIVE METABOLIC PANEL - Abnormal;  Notable for the following components:      Result Value   Potassium 2.9 (*)    CO2 21 (*)    Glucose, Bld 107 (*)    All other components within normal limits  LIPASE, BLOOD  CBC  URINALYSIS, ROUTINE W REFLEX MICROSCOPIC    EKG None  Radiology No results found.  Procedures Procedures (including critical care time)  Medications Ordered in ED Medications  sodium chloride 0.9 % bolus 1,000 mL (has no administration in time range)  ondansetron (ZOFRAN) injection 4  mg (has no administration in time range)     Initial Impression / Assessment and Plan / ED Course  I have reviewed the triage vital signs and the nursing notes.  Pertinent labs & imaging results that were available during my care of the patient were reviewed by me and considered in my medical decision making (see chart for details).     Pt with N/V/D for greater than 12 hours.  Mucous membranes are dry and she is mildly tachycardic.  Will check labs and give anti-emetic and IVF's.    On recheck, pt is feeling better, now tolerating oral fluid trial.  Vitals improved.  Abdomen remains soft, non-tender.  Sx's are likely viral, other family embers here for similar symptoms.  hypokalemia felt to be secondary to vomiting and diarrhea, she appears appropriate for d/c home.  Rx's for potassium an zofran.  Return precautions discussed.   Final Clinical Impressions(s) / ED Diagnoses   Final diagnoses:  Nausea vomiting and diarrhea  Hypokalemia    ED Discharge Orders    None       Kem Parkinson, PA-C 06/13/18 1959    Nat Christen, MD 06/13/18 2037

## 2018-06-13 NOTE — Discharge Instructions (Signed)
Small, frequent sips of fluids.  Bland diet as tolerated.  Follow-up with your doctor for recheck

## 2018-06-13 NOTE — ED Notes (Signed)
Fluids complete 1000 of normal saline.

## 2018-11-13 ENCOUNTER — Emergency Department (HOSPITAL_COMMUNITY)
Admission: EM | Admit: 2018-11-13 | Discharge: 2018-11-13 | Disposition: A | Discharge status: Home / Self Care | Payer: Medicare Other | Attending: Emergency Medicine | Admitting: Emergency Medicine

## 2018-11-13 ENCOUNTER — Other Ambulatory Visit: Payer: Self-pay

## 2018-11-13 ENCOUNTER — Encounter (HOSPITAL_COMMUNITY): Payer: Self-pay

## 2018-11-13 DIAGNOSIS — Y939 Activity, unspecified: Secondary | ICD-10-CM | POA: Diagnosis not present

## 2018-11-13 DIAGNOSIS — W57XXXA Bitten or stung by nonvenomous insect and other nonvenomous arthropods, initial encounter: Secondary | ICD-10-CM | POA: Insufficient documentation

## 2018-11-13 DIAGNOSIS — Z79899 Other long term (current) drug therapy: Secondary | ICD-10-CM | POA: Insufficient documentation

## 2018-11-13 DIAGNOSIS — S0006XA Insect bite (nonvenomous) of scalp, initial encounter: Secondary | ICD-10-CM | POA: Diagnosis present

## 2018-11-13 DIAGNOSIS — F17210 Nicotine dependence, cigarettes, uncomplicated: Secondary | ICD-10-CM | POA: Diagnosis not present

## 2018-11-13 DIAGNOSIS — Y929 Unspecified place or not applicable: Secondary | ICD-10-CM | POA: Diagnosis not present

## 2018-11-13 DIAGNOSIS — Y999 Unspecified external cause status: Secondary | ICD-10-CM | POA: Insufficient documentation

## 2018-11-13 MED ORDER — HYDROCODONE-ACETAMINOPHEN 5-325 MG PO TABS: 1.0000 | ORAL_TABLET | ORAL | 0 refills | Status: DC | PRN

## 2018-11-13 MED ORDER — DOXYCYCLINE HYCLATE 100 MG PO CAPS: 100.0000 mg | ORAL_CAPSULE | Freq: Two times a day (BID) | ORAL | 0 refills | Status: DC

## 2018-11-13 MED ORDER — DOXYCYCLINE HYCLATE 100 MG PO TABS
100.0000 mg | ORAL_TABLET | Freq: Once | ORAL | Status: AC
  Administered 2018-11-13: 100 mg via ORAL
  Filled 2018-11-13: qty 1

## 2018-11-13 NOTE — ED Triage Notes (Addendum)
Pt reports that she pulled a tick off the back of her head and her leg 6 days ago and she has swelling from where tick was removed along with increase pain. Pt reports no energy and increase pain from tickbite in head.

## 2018-11-13 NOTE — ED Provider Notes (Signed)
[facility] EMERGENCY DEPARTMENT Provider Note   CSN: [id number] Arrival date & time: 11/13/18  1641    History   Chief Complaint Chief Complaint  Patient presents with  . Tick Removal    HPI Jenna Zamora is a 48 y.o. female presenting with increased pain and swelling surrounding the site of several tick bites to her left scalp which she removed 6 days ago.  She also removed a 3rd tick from her left lower leg but this site is not causing pain or swelling.  She has applied ice without relief and is taking tylenol and ibuprofen every 6 hours with no significant improvement. She denies fevers, chills, headache or rash but reports increasing fatigue.       The history is provided by the patient.    Past Medical History:  Diagnosis Date  . Anemia   . Arthritis   . Bipolar 1 disorder ([place])   . Blood transfusion without reported diagnosis   . Chronic back pain   . Collagen vascular disease ([place])   . DDD (degenerative disc disease)   . Emphysema of lung ([place])   . GERD (gastroesophageal reflux disease)   . Hyperlipidemia   . Lumbar herniated disc   . Neuropathy   . Panic attacks   . Pituitary adenoma ([place])   . PTSD (post-traumatic stress disorder)   . Rheumatoid arthritis [facility])     Patient Active Problem List   Diagnosis Date Noted  . Herniated vertebral disc 04/20/2013  . DDD (degenerative disc disease) 04/20/2013  . 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 PTSD (post-traumatic stress disorder) 04/20/2013  . 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 Insomnia 04/20/2013  . GAD (generalized anxiety disorder) 04/20/2013  . Chronic pain syndrome 04/20/2013  . Incontinence 04/20/2013    Past Surgical History:  Procedure Laterality Date  . ABDOMINAL SURGERY    . DENTAL SURGERY    . HIP SURGERY    . NECK SURGERY    . TUBAL LIGATION       OB History    Gravida  4   Para  3   Term  3   Preterm      AB  1   Living        SAB  1   TAB      Ectopic     Multiple      Live Births               Home Medications    Prior to Admission medications   Medication Sig Start Date End Date Taking? Authorizing Provider  acetaminophen (TYLENOL) 500 MG tablet Take 500 mg by mouth every 6 (six) hours as needed for mild pain.    [provider]  ALPRAZolam (XANAX) 1 MG tablet TAKE ONE TABLET BY MOUTH TWICE DAILY AS NEEDED Patient taking differently: TAKE ONE TABLET (1 mg)  BY MOUTH three times a day 05/05/14   Chevis Pretty, FNP  cyclobenzaprine (FLEXERIL) 10 MG tablet Take 1 tablet (10 mg total) by mouth 2 (two) times daily as needed for muscle spasms. 05/31/18   Law, Bea Graff, PA-C  doxycycline (VIBRAMYCIN) 100 MG capsule Take 1 capsule (100 mg total) by mouth 2 (two) times daily. 11/13/18   Evalee Jefferson, PA-C  HYDROcodone-acetaminophen (NORCO/VICODIN) 5-325 MG tablet Take 1 tablet by mouth every 4 (four) hours as needed. 11/13/18   Evalee Jefferson, PA-C  ibuprofen (ADVIL,MOTRIN) 600 MG tablet Take 1 tablet (600 mg total) by mouth every  6 (six) hours as needed. Patient taking differently: Take 600 mg by mouth every 6 (six) hours as needed for moderate pain.  05/31/18   Law, Bea Graff, PA-C  ondansetron (ZOFRAN) 4 MG tablet Take 1 tablet (4 mg total) by mouth every 6 (six) hours. 06/13/18   Triplett, Tammy, PA-C  oxyCODONE-acetaminophen (PERCOCET/ROXICET) 5-325 MG tablet Take 1-2 tablets by mouth every 6 (six) hours as needed for severe pain. Patient not taking: Reported on 06/13/2018 05/31/18   Frederica Kuster, PA-C  potassium chloride SA (K-DUR,KLOR-CON) 20 MEQ tablet Take 1 tablet (20 mEq total) by mouth 2 (two) times daily. 06/13/18   Triplett, Tammy, PA-C  traZODone (DESYREL) 100 MG tablet Take 150 mg by mouth at bedtime. 12/25/15   [provider]    Family History Family History  Problem Relation Age of Onset  . Cancer Mother        ovarian  . Heart disease Mother   . Cancer Maternal Aunt        breast  . Cancer  Maternal Grandmother        ovarian    Social History Social History   Tobacco Use  . Smoking status: Current Every Day Smoker    Packs/day: 1.00    Types: Cigarettes    Start date: 07/28/2005  . Smokeless tobacco: Never Used  Substance Use Topics  . Alcohol use: No  . Drug use: No     Allergies   Morphine and related; Codeine; and Tramadol   Review of Systems Review of Systems  Constitutional: Positive for fatigue. Negative for chills and fever.  HENT: Negative for congestion.   Eyes: Negative.   Respiratory: Negative for chest tightness and shortness of breath.   Cardiovascular: Negative for chest pain.  Gastrointestinal: Negative for abdominal pain, nausea and vomiting.  Genitourinary: Negative.   Musculoskeletal: Negative for arthralgias, joint swelling, neck pain and neck stiffness.  Skin: Positive for wound. Negative for rash.  Neurological: Negative for dizziness, weakness, light-headedness, numbness and headaches.  Psychiatric/Behavioral: Negative.      Physical Exam Updated Vital Signs BP (!) 137/107 (BP Location: Right Arm)   Pulse 100   Temp (!) 97.5 F (36.4 C) (Oral)   Resp 18   Ht 5\' 5"  (1.651 m)   Wt 58.5 kg   SpO2 96%   BMI 21.47 kg/m   Physical Exam Vitals signs and nursing note reviewed.  Constitutional:      Appearance: She is well-developed.  HENT:     Head: Normocephalic and atraumatic.      Comments: Bite site with mild erythema 1 cm surrounding site.  Tender without edema. Left posterior auricle and occipital nodes ttp and enlarged.    Mouth/Throat:     Tongue: No lesions.     Pharynx: Oropharynx is clear. Uvula midline.     Tonsils: No tonsillar exudate.  Eyes:     Conjunctiva/sclera: Conjunctivae normal.  Neck:     Musculoskeletal: Full passive range of motion without pain, normal range of motion and neck supple.  Cardiovascular:     Rate and Rhythm: Normal rate and regular rhythm.     Heart sounds: Normal heart sounds.   Pulmonary:     Effort: Pulmonary effort is normal.     Breath sounds: Normal breath sounds. No wheezing.  Abdominal:     General: Bowel sounds are normal.     Palpations: Abdomen is soft.     Tenderness: There is no abdominal tenderness.  Musculoskeletal: Normal  range of motion.  Skin:    General: Skin is warm and dry.     Comments: Small tick bite site left lower medial leg which does not appear infected and is nontender (pt reports itchy).  Neurological:     Mental Status: She is alert.      ED Treatments / Results  Labs (all labs ordered are listed, but only abnormal results are displayed) Labs Reviewed - No data to display  EKG None  Radiology No results found.  Procedures Procedures (including critical care time)  Medications Ordered in ED Medications  doxycycline (VIBRA-TABS) tablet 100 mg (has no administration in time range)     Initial Impression / Assessment and Plan / ED Course  I have reviewed the triage vital signs and the nursing notes.  Pertinent labs & imaging results that were available during my care of the patient were reviewed by me and considered in my medical decision making (see chart for details).        Pt with history of tick bites x 2 left scalp, 1 left lower leg.  Scalp sx and exam suggesting early cellulitis vs localized inflammatory reaction.  She was started on doxycycline.  Few hydrocodone prescribed, pt will continue with ibuprofen.  Warm compresses to scalp. F/u pcp if sx persist or worsen.   Final Clinical Impressions(s) / ED Diagnoses   Final diagnoses:  Tick bite, initial encounter    ED Discharge Orders         Ordered    doxycycline (VIBRAMYCIN) 100 MG capsule  2 times daily     11/13/18 1715    HYDROcodone-acetaminophen (NORCO/VICODIN) 5-325 MG tablet  Every 4 hours PRN     11/13/18 1715           Evalee Jefferson, PA-C 11/13/18 [place], [place], DO 11/14/18 2233

## 2018-11-13 NOTE — Discharge Instructions (Addendum)
Your exam suggests that you may have an early skin infection of your scalp from this tick bite.  You may apply warm compresses as discussed.  Take your next dose of the antibiotic tomorrow morning.  You may take the hydrocodone prescribed for pain relief.  This will make you drowsy - do not drive within 4 hours of taking this medication.

## 2019-09-28 ENCOUNTER — Emergency Department (HOSPITAL_COMMUNITY)
Admission: EM | Admit: 2019-09-28 | Discharge: 2019-09-29 | Disposition: A | Discharge status: Home / Self Care | Payer: Medicare Other | Attending: Emergency Medicine | Admitting: Emergency Medicine

## 2019-09-28 ENCOUNTER — Encounter (HOSPITAL_COMMUNITY): Payer: Self-pay

## 2019-09-28 ENCOUNTER — Emergency Department (HOSPITAL_COMMUNITY): Payer: Medicare Other

## 2019-09-28 DIAGNOSIS — R0789 Other chest pain: Secondary | ICD-10-CM | POA: Insufficient documentation

## 2019-09-28 DIAGNOSIS — R230 Cyanosis: Secondary | ICD-10-CM | POA: Insufficient documentation

## 2019-09-28 DIAGNOSIS — Z5321 Procedure and treatment not carried out due to patient leaving prior to being seen by health care provider: Secondary | ICD-10-CM | POA: Diagnosis not present

## 2019-09-28 LAB — BASIC METABOLIC PANEL
Anion gap: 8 (ref 5–15)
BUN: 7 mg/dL (ref 6–20)
CO2: 23 mmol/L (ref 22–32)
Calcium: 8.7 mg/dL — ABNORMAL LOW (ref 8.9–10.3)
Chloride: 107 mmol/L (ref 98–111)
Creatinine, Ser: 0.76 mg/dL (ref 0.44–1.00)
GFR calc Af Amer: >60 mL/min (ref >60)
GFR calc non Af Amer: >60 mL/min (ref >60)
Glucose, Bld: 100 mg/dL — ABNORMAL HIGH (ref 70–99)
Potassium: 3.7 mmol/L (ref 3.5–5.1)
Sodium: 138 mmol/L (ref 135–145)

## 2019-09-28 LAB — I-STAT BETA HCG BLOOD, ED (MC, WL, AP ONLY): I-stat hCG, quantitative: <5 m[IU]/mL (ref <5)

## 2019-09-28 LAB — CBC
HCT: 38 % (ref 36.0–46.0)
Hemoglobin: 12.6 g/dL (ref 12.0–15.0)
MCH: 32.4 pg (ref 26.0–34.0)
MCHC: 33.2 g/dL (ref 30.0–36.0)
MCV: 97.7 fL (ref 80.0–100.0)
Platelets: 197 10*3/uL (ref 150–400)
RBC: 3.89 MIL/uL (ref 3.87–5.11)
RDW: 12.7 % (ref 11.5–15.5)
WBC: 7.3 10*3/uL (ref 4.0–10.5)
nRBC: 0 % (ref 0.0–0.2)

## 2019-09-28 LAB — TROPONIN I (HIGH SENSITIVITY): Troponin I (High Sensitivity): 3 ng/L (ref <18)

## 2019-09-28 NOTE — ED Triage Notes (Signed)
Pt arrives POV for eval of intermittent chest pain x 3 weeks. Pt reports she does have intermittent chest tightness, pt reports she noted that her hands have been intermittently blue today. Pt reports ongoing chest tightness at this time.

## 2019-09-29 LAB — TROPONIN I (HIGH SENSITIVITY): Troponin I (High Sensitivity): 3 ng/L (ref <18)

## 2019-09-29 NOTE — ED Notes (Signed)
Pt has decided to leave 

## 2020-10-03 DIAGNOSIS — G4700 Insomnia, unspecified: Secondary | ICD-10-CM | POA: Diagnosis not present

## 2020-10-03 DIAGNOSIS — Z1211 Encounter for screening for malignant neoplasm of colon: Secondary | ICD-10-CM | POA: Diagnosis not present

## 2020-10-03 DIAGNOSIS — E7800 Pure hypercholesterolemia, unspecified: Secondary | ICD-10-CM | POA: Diagnosis not present

## 2020-10-04 ENCOUNTER — Telehealth: Payer: Self-pay

## 2020-10-04 NOTE — Telephone Encounter (Signed)
NOTES ON FILE FROM [facility] SENT REFERRAL TO SCHEDULING

## 2020-10-17 ENCOUNTER — Telehealth: Payer: Self-pay

## 2020-10-17 NOTE — Telephone Encounter (Signed)
FAXED NOTES TO [facility] [phone]

## 2020-10-31 DIAGNOSIS — E785 Hyperlipidemia, unspecified: Secondary | ICD-10-CM | POA: Insufficient documentation

## 2020-10-31 DIAGNOSIS — Z8249 Family history of ischemic heart disease and other diseases of the circulatory system: Secondary | ICD-10-CM | POA: Insufficient documentation

## 2020-10-31 NOTE — Progress Notes (Deleted)
Cardiology Office Note   Date:  10/31/2020   ID:  Jenna Zamora, DOB 31-Jul-1970, MRN [id number]  PCP:  Lujean Amel, MD  Cardiologist:   No primary care provider on file. Referring:  Lujean Amel, MD  No chief complaint on file.     History of Present Illness: Jenna Zamora is a 50 y.o. female who is referred by *** for evaluation of multiple cardiovascular risk factors.  She does not have a history of coronary disease.  However, she has multiple risk factors.    Past Medical History:  Diagnosis Date  . Anemia   . Arthritis   . Bipolar 1 disorder ([place])   . Blood transfusion without reported diagnosis   . Chronic back pain   . Collagen vascular disease ([place])   . DDD (degenerative disc disease)   . Emphysema of lung ([place])   . GERD (gastroesophageal reflux disease)   . Hyperlipidemia   . Lumbar herniated disc   . Neuropathy   . Panic attacks   . Pituitary adenoma ([place])   . PTSD (post-traumatic stress disorder)   . Rheumatoid arthritis [facility])     Past Surgical History:  Procedure Laterality Date  . ABDOMINAL SURGERY    . DENTAL SURGERY    . HIP SURGERY    . NECK SURGERY    . TUBAL LIGATION       Current Outpatient Medications  Medication Sig Dispense Refill  . acetaminophen (TYLENOL) 500 MG tablet Take 500 mg by mouth every 6 (six) hours as needed for mild pain.    Marland Kitchen ALPRAZolam (XANAX) 1 MG tablet TAKE ONE TABLET BY MOUTH TWICE DAILY AS NEEDED (Patient taking differently: TAKE ONE TABLET (1 mg)  BY MOUTH three times a day) 60 tablet 0  . cyclobenzaprine (FLEXERIL) 10 MG tablet Take 1 tablet (10 mg total) by mouth 2 (two) times daily as needed for muscle spasms. 20 tablet 0  . doxycycline (VIBRAMYCIN) 100 MG capsule Take 1 capsule (100 mg total) by mouth 2 (two) times daily. 20 capsule 0  . HYDROcodone-acetaminophen (NORCO/VICODIN) 5-325 MG tablet Take 1 tablet by mouth every 4 (four) hours as needed. 10 tablet 0  . ibuprofen (ADVIL,MOTRIN) 600 MG tablet Take  1 tablet (600 mg total) by mouth every 6 (six) hours as needed. (Patient taking differently: Take 600 mg by mouth every 6 (six) hours as needed for moderate pain. ) 30 tablet 0  . ondansetron (ZOFRAN) 4 MG tablet Take 1 tablet (4 mg total) by mouth every 6 (six) hours. 12 tablet 0  . oxyCODONE-acetaminophen (PERCOCET/ROXICET) 5-325 MG tablet Take 1-2 tablets by mouth every 6 (six) hours as needed for severe pain. (Patient not taking: Reported on 06/13/2018) 12 tablet 0  . potassium chloride SA (K-DUR,KLOR-CON) 20 MEQ tablet Take 1 tablet (20 mEq total) by mouth 2 (two) times daily. 10 tablet 0  . traZODone (DESYREL) 100 MG tablet Take 150 mg by mouth at bedtime.     No current facility-administered medications for this visit.    Allergies:   Morphine and related, Codeine, and Tramadol    Social History:  The patient  reports that she has been smoking cigarettes. She started smoking about 15 years ago. She has been smoking about 1.00 pack per day. She has never used smokeless tobacco. She reports that she does not drink alcohol and does not use drugs.   Family History:  The patient's ***family history includes Cancer in her maternal aunt, maternal grandmother,  and mother; Heart disease in her mother.    ROS:  Please see the history of present illness.   Otherwise, review of systems are positive for {NONE DEFAULTED:18576::"none"}.   All other systems are reviewed and negative.    PHYSICAL EXAM: VS:  There were no vitals taken for this visit. , BMI There is no height or weight on file to calculate BMI. GENERAL:  Well appearing HEENT:  Pupils equal round and reactive, fundi not visualized, oral mucosa unremarkable NECK:  No jugular venous distention, waveform within normal limits, carotid upstroke brisk and symmetric, no bruits, no thyromegaly LYMPHATICS:  No cervical, inguinal adenopathy LUNGS:  Clear to auscultation bilaterally BACK:  No CVA tenderness CHEST:  Unremarkable HEART:  PMI not  displaced or sustained,S1 and S2 within normal limits, no S3, no S4, no clicks, no rubs, *** murmurs ABD:  Flat, positive bowel sounds normal in frequency in pitch, no bruits, no rebound, no guarding, no midline pulsatile mass, no hepatomegaly, no splenomegaly EXT:  2 plus pulses throughout, no edema, no cyanosis no clubbing SKIN:  No rashes no nodules NEURO:  Cranial nerves II through XII grossly intact, motor grossly intact throughout PSYCH:  Cognitively intact, oriented to person place and time    EKG:  EKG {ACTION; IS/IS GNO:03704888} ordered today. The ekg ordered today demonstrates ***   Recent Labs: No results found for requested labs within last 8760 hours.    Lipid Panel No results found for: CHOL, TRIG, HDL, CHOLHDL, VLDL, LDLCALC, LDLDIRECT    Wt Readings from Last 3 Encounters:  09/28/19 128 lb 15.5 oz (58.5 kg)  11/13/18 129 lb (58.5 kg)  06/13/18 135 lb (61.2 kg)      Other studies Reviewed: Additional studies/ records that were reviewed today include: ***. Review of the above records demonstrates:  Please see elsewhere in the note.  ***   ASSESSMENT AND PLAN:  FAMILY HISTORY OF CAD:***  DYSLIPIDEMIA:  ***   Current medicines are reviewed at length with the patient today.  The patient {ACTIONS; HAS/DOES NOT HAVE:19233} concerns regarding medicines.  The following changes have been made:  {PLAN; NO CHANGE:13088:s}  Labs/ tests ordered today include: *** No orders of the defined types were placed in this encounter.    Disposition:   FU with ***    Signed, Minus Breeding, MD  10/31/2020 9:44 PM    [facility]

## 2020-11-01 ENCOUNTER — Ambulatory Visit: Payer: Medicare Other | Admitting: Cardiology

## 2020-11-01 DIAGNOSIS — Z8249 Family history of ischemic heart disease and other diseases of the circulatory system: Secondary | ICD-10-CM

## 2020-11-01 DIAGNOSIS — E785 Hyperlipidemia, unspecified: Secondary | ICD-10-CM

## 2020-11-12 ENCOUNTER — Ambulatory Visit (INDEPENDENT_AMBULATORY_CARE_PROVIDER_SITE_OTHER): Payer: Medicare Other | Admitting: Behavioral Health

## 2020-11-12 ENCOUNTER — Encounter: Payer: Self-pay | Admitting: Behavioral Health

## 2020-11-12 ENCOUNTER — Other Ambulatory Visit: Payer: Self-pay

## 2020-11-12 VITALS — BP 144/89 | HR 81 | Ht 61.0 in | Wt 148.0 lb

## 2020-11-12 DIAGNOSIS — F4310 Post-traumatic stress disorder, unspecified: Secondary | ICD-10-CM

## 2020-11-12 DIAGNOSIS — F411 Generalized anxiety disorder: Secondary | ICD-10-CM | POA: Diagnosis not present

## 2020-11-12 DIAGNOSIS — F331 Major depressive disorder, recurrent, moderate: Secondary | ICD-10-CM

## 2020-11-12 MED ORDER — SERTRALINE HCL 50 MG PO TABS: 50.0000 mg | ORAL_TABLET | Freq: Every day | ORAL | 1 refills | Status: DC

## 2020-11-12 MED ORDER — PRAZOSIN HCL 1 MG PO CAPS: 2.0000 mg | ORAL_CAPSULE | Freq: Every day | ORAL | Status: DC

## 2020-11-12 NOTE — Patient Instructions (Signed)
Follow-up in 4 weeks

## 2020-11-12 NOTE — Progress Notes (Signed)
Charted in error  last visit. Duplicate entry.

## 2020-11-12 NOTE — Progress Notes (Signed)
[facility] MD/PA/NP Initial Note  11/12/2020 4:26 PM Jenna Zamora  MRN:  [id number]  Chief Complaint:  Chief Complaint    Anxiety; Depression      HPI: 50 year old female presents to this office to establish care. She say, " I'm still suffering from PTSD from having childhood cancer when I was 76. I constantly worry about my family and have nightmares almost every night". She says that she has so much responsibility caring for grandchildren and father-in-law that suffered from a stroke. She said she falls asleep but is awakened from the nightmares. She says that she is currently taking 150 mg of trazodone but still not getting quality sleep. She said that this increases her anxiety. She reports anxiety of 7 on 10 scale and depression also a 7. Says she gets 6-7 hours broken sleep. Pt does report drinking 3 mountain dews per day. She says that she would like to try a new medication that would help with anxiety and depression and also something for the nightmares. Pt reports smoking 1 pack per day cigarettes. Says that mother who is now deceased was diagnosed with bipolar and her youngest daughter has also been diagnosed with bipolar.   Past medication failures: Abilify Alprazolam  Visit Diagnosis:    ICD-10-CM   1. PTSD (post-traumatic stress disorder)  F43.10 sertraline (ZOLOFT) 50 MG tablet    prazosin (MINIPRESS) capsule 2 mg  2. Generalized anxiety disorder  F41.1 sertraline (ZOLOFT) 50 MG tablet  3. Major depressive disorder, recurrent episode, moderate (HCC)  F33.1 sertraline (ZOLOFT) 50 MG tablet    Past Psychiatric History:   Past Medical History:  Past Medical History:  Diagnosis Date  . Anemia   . Arthritis   . Bipolar 1 disorder ([place])   . Blood transfusion without reported diagnosis   . Chronic back pain   . Collagen vascular disease ( [place])   . DDD (degenerative disc disease)   . Emphysema of lung ([place])   . GERD (gastroesophageal reflux disease)   . Hyperlipidemia    . Lumbar herniated disc   . Neuropathy   . Panic attacks   . Pituitary adenoma ([place])   . PTSD (post-traumatic stress disorder)   . Rheumatoid arthritis [facility])     Past Surgical History:  Procedure Laterality Date  . ABDOMINAL SURGERY    . DENTAL SURGERY    . HIP SURGERY    . NECK SURGERY    . TUBAL LIGATION      Family Psychiatric History:   Family History:  Family History  Problem Relation Age of Onset  . Cancer Mother        ovarian  . Heart disease Mother   . Bipolar disorder Mother   . Cancer Maternal Aunt        breast  . Cancer Maternal Grandmother        ovarian  . Bipolar disorder Daughter     Social History:  Social History   Socioeconomic History  . Marital status: Married    Spouse name: Not on file  . Number of children: Not on file  . Years of education: Not on file  . Highest education level: Not on file  Occupational History  . Not on file  Tobacco Use  . Smoking status: Current Every Day Smoker    Packs/day: 1.00    Types: Cigarettes    Start date: 07/28/2005  . Smokeless tobacco: Never Used  Vaping Use  . Vaping Use: Former  Substance and  Sexual Activity  . Alcohol use: No  . Drug use: No  . Sexual activity: Yes    Partners: Male  Other Topics Concern  . Not on file  Social History Narrative  . Not on file   Social Determinants of Health   Financial Resource Strain: Not on file  Food Insecurity: Not on file  Transportation Needs: Not on file  Physical Activity: Not on file  Stress: Not on file  Social Connections: Not on file    Allergies:  Allergies  Allergen Reactions  . Morphine And Related Anaphylaxis, Hives, Itching and Swelling  . Codeine Hives and Itching  . Tramadol Hives    Metabolic Disorder Labs: No results found for: HGBA1C, MPG No results found for: PROLACTIN No results found for: CHOL, TRIG, HDL, CHOLHDL, VLDL, LDLCALC No results found for: TSH  Therapeutic Level Labs: No results found for: LITHIUM No  results found for: VALPROATE No components found for:  CBMZ  Current Medications: Current Outpatient Medications  Medication Sig Dispense Refill  . acetaminophen (TYLENOL) 500 MG tablet Take 500 mg by mouth every 6 (six) hours as needed for mild pain.    . cyclobenzaprine (FLEXERIL) 10 MG tablet Take 1 tablet (10 mg total) by mouth 2 (two) times daily as needed for muscle spasms. 20 tablet 0  . ibuprofen (ADVIL,MOTRIN) 600 MG tablet Take 1 tablet (600 mg total) by mouth every 6 (six) hours as needed. (Patient taking differently: Take 600 mg by mouth every 6 (six) hours as needed for moderate pain.) 30 tablet 0  . sertraline (ZOLOFT) 50 MG tablet Take 1 tablet (50 mg total) by mouth daily. Take 1/2 tab 25 mg for 7 days then one whole tab. 30 tablet 1  . traZODone (DESYREL) 100 MG tablet Take 150 mg by mouth at bedtime.    . ALPRAZolam (XANAX) 1 MG tablet TAKE ONE TABLET BY MOUTH TWICE DAILY AS NEEDED (Patient not taking: Reported on 11/12/2020) 60 tablet 0  . doxycycline (VIBRAMYCIN) 100 MG capsule Take 1 capsule (100 mg total) by mouth 2 (two) times daily. (Patient not taking: Reported on 11/12/2020) 20 capsule 0  . HYDROcodone-acetaminophen (NORCO/VICODIN) 5-325 MG tablet Take 1 tablet by mouth every 4 (four) hours as needed. (Patient not taking: Reported on 11/12/2020) 10 tablet 0  . ondansetron (ZOFRAN) 4 MG tablet Take 1 tablet (4 mg total) by mouth every 6 (six) hours. (Patient not taking: Reported on 11/12/2020) 12 tablet 0  . oxyCODONE-acetaminophen (PERCOCET/ROXICET) 5-325 MG tablet Take 1-2 tablets by mouth every 6 (six) hours as needed for severe pain. (Patient not taking: No sig reported) 12 tablet 0  . potassium chloride SA (K-DUR,KLOR-CON) 20 MEQ tablet Take 1 tablet (20 mEq total) by mouth 2 (two) times daily. (Patient not taking: Reported on 11/12/2020) 10 tablet 0   Current Facility-Administered Medications  Medication Dose Route Frequency Provider Last Rate Last Admin  . prazosin  (MINIPRESS) capsule 2 mg  2 mg Oral QHS Roddrick Sharron A, NP        Medication Side Effects:none  Orders placed this visit:  No orders of the defined types were placed in this encounter.   Psychiatric Specialty Exam:  Review of Systems  Constitutional: Positive for appetite change.  Genitourinary: Negative.   Skin: Negative.   Neurological: Negative.   Hematological: Negative.   Psychiatric/Behavioral: Positive for dysphoric mood. The patient is nervous/anxious.     Blood pressure (!) 144/89, pulse 81, height 5\' 1"  (1.549 m), weight 148 lb (  67.1 kg).Body mass index is 27.96 kg/m.  General Appearance: Casual and Fairly Groomed  Eye Contact:  Good  Speech:  Clear and Coherent  Volume:  Normal  Mood:  Anxious  Affect:  Appropriate  Thought Process:  Coherent  Orientation:  Full (Time, Place, and Person)  Thought Content: Logical   Suicidal Thoughts:  No  Homicidal Thoughts:  No  Memory:  WNL  Judgement:  Good  Insight:  Good  Psychomotor Activity:  Normal  Concentration:  Concentration: Good  Recall:  Good  Fund of Knowledge: Good  Language: Good  Assets:  Desire for Improvement  ADL's:  Intact  Cognition: WNL  Prognosis:  Good   Screenings:  GAD-7   Flowsheet Row Office Visit from 11/12/2020 in [facility]  Total GAD-7 Score 12    PHQ2-9   [place] Office Visit from 11/12/2020 in [place] Visit from 02/07/2014 in [place] Visit from 06/08/2013 in [place]  PHQ-2 Total Score 4 5 0  PHQ-9 Total Score 11 20 --      Receiving Psychotherapy: No   Treatment Plan/Recommendations:  Will continue trazodone 150 mg Will start Zoloft 50 mg. Will take 1/2 tab 25 mg for one week and then one whole tab. Take with food consistently to avoid upset stomach  Will start prazosin 2 mg capsule at bedtime. Will report and side effects or changes in negative changes in  promptly. Will follow up in 4 weeks to reassess anxiety and sleep. To monitor for S&S of bleeding. Educated on risk of ibuprofen and Zoloft together.  Recommended psychotherapy    Elwanda Brooklyn, NP

## 2020-11-14 ENCOUNTER — Other Ambulatory Visit: Payer: Self-pay

## 2020-11-14 ENCOUNTER — Telehealth: Payer: Self-pay | Admitting: Behavioral Health

## 2020-11-14 MED ORDER — PRAZOSIN HCL 2 MG PO CAPS: 2.0000 mg | ORAL_CAPSULE | Freq: Every day | ORAL | 0 refills | Status: DC

## 2020-11-14 NOTE — Telephone Encounter (Signed)
Yes, please send. I accidentally placed the order under clinic. You can send.

## 2020-11-14 NOTE — Telephone Encounter (Signed)
Please review

## 2020-11-14 NOTE — Telephone Encounter (Signed)
It won't allow me to send it

## 2020-11-14 NOTE — Addendum Note (Signed)
Addended by: Lesle Chris A on: 11/14/2020 05:48 PM   Modules accepted: Orders

## 2020-11-14 NOTE — Telephone Encounter (Signed)
Jenna Zamora called and said that the pharmacy doesn't have her Prazosin 2 mg order. Can this be sent to  [id number] - [place], [place] Phone:  [phone]  Fax:  [phone]

## 2020-12-03 ENCOUNTER — Ambulatory Visit: Payer: Medicare Other | Admitting: Behavioral Health

## 2020-12-05 ENCOUNTER — Ambulatory Visit: Payer: Medicare Other | Admitting: Behavioral Health

## 2020-12-06 ENCOUNTER — Other Ambulatory Visit: Payer: Self-pay | Admitting: Behavioral Health

## 2020-12-06 DIAGNOSIS — F4310 Post-traumatic stress disorder, unspecified: Secondary | ICD-10-CM

## 2020-12-07 ENCOUNTER — Other Ambulatory Visit: Payer: Self-pay | Admitting: Behavioral Health

## 2020-12-07 NOTE — Telephone Encounter (Signed)
Please review

## 2020-12-07 NOTE — Telephone Encounter (Signed)
Medication is approved for refill

## 2020-12-07 NOTE — Telephone Encounter (Signed)
Please approve

## 2020-12-12 ENCOUNTER — Other Ambulatory Visit: Payer: Self-pay

## 2020-12-12 ENCOUNTER — Ambulatory Visit (INDEPENDENT_AMBULATORY_CARE_PROVIDER_SITE_OTHER): Payer: Medicare Other | Admitting: Behavioral Health

## 2020-12-12 ENCOUNTER — Encounter: Payer: Self-pay | Admitting: Behavioral Health

## 2020-12-12 DIAGNOSIS — F411 Generalized anxiety disorder: Secondary | ICD-10-CM

## 2020-12-12 DIAGNOSIS — F4310 Post-traumatic stress disorder, unspecified: Secondary | ICD-10-CM

## 2020-12-12 DIAGNOSIS — F331 Major depressive disorder, recurrent, moderate: Secondary | ICD-10-CM | POA: Diagnosis not present

## 2020-12-12 MED ORDER — BUSPIRONE HCL 15 MG PO TABS: ORAL_TABLET | ORAL | 1 refills | Status: DC

## 2020-12-12 MED ORDER — BUPROPION HCL ER (XL) 150 MG PO TB24: 150.0000 mg | ORAL_TABLET | Freq: Every day | ORAL | 1 refills | Status: DC

## 2020-12-12 MED ORDER — ALPRAZOLAM 1 MG PO TABS: 1.0000 mg | ORAL_TABLET | Freq: Two times a day (BID) | ORAL | 0 refills | Status: DC

## 2020-12-12 NOTE — Progress Notes (Signed)
[facility] Med Check  Patient ID: MAKENAH KARAS,  MRN: [id number]  PCP: Lujean Amel, MD  Date of Evaluation: 12/12/2020 Time spent:30 minutes  Chief Complaint:  Chief Complaint    Anxiety; Depression; Follow-up; Trauma      HISTORY/CURRENT STATUS: HPI  50 year old female presented to this office for follow up and medication management. She said, " I took the zoloft for 3 weeks and I just could not take it. It made me extremely tired and I am already a low energy person". She said that Prazosin is helping with nightmares and that she has not had any problems since starting the medication. She says that she would like to try a medication that would not make her so tired. She also says that she has been taking xanax 1 mg twice daily and it has been the only thing that works in calming her nerves effectively. She is willing to try another medication for anxiety. She report anxiety today at 5 and depression a 3. No mania, no psychosis. No SI/HI.   Past medication failures: Abilify Zoloft  Individual Medical History/ Review of Systems: Changes? :No   Allergies: Morphine and related, Codeine, and Tramadol  Current Medications:  Current Outpatient Medications:  .  ALPRAZolam (XANAX) 1 MG tablet, Take 1 tablet (1 mg total) by mouth 2 (two) times daily., Disp: 60 tablet, Rfl: 0 .  buPROPion (WELLBUTRIN XL) 150 MG 24 hr tablet, Take 1 tablet (150 mg total) by mouth daily., Disp: 30 tablet, Rfl: 1 .  busPIRone (BUSPAR) 15 MG tablet, Take 1/3 tablet p.o. twice daily for 1 week, then take 2/3 tablet p.o. twice daily for 1 week, then take 1 tablet p.o. twice daily, Disp: 60 tablet, Rfl: 1 .  acetaminophen (TYLENOL) 500 MG tablet, Take 500 mg by mouth every 6 (six) hours as needed for mild pain., Disp: , Rfl:  .  cyclobenzaprine (FLEXERIL) 10 MG tablet, Take 1 tablet (10 mg total) by mouth 2 (two) times daily as needed for muscle spasms., Disp: 20 tablet, Rfl: 0 .  doxycycline  (VIBRAMYCIN) 100 MG capsule, Take 1 capsule (100 mg total) by mouth 2 (two) times daily. (Patient not taking: Reported on 11/12/2020), Disp: 20 capsule, Rfl: 0 .  HYDROcodone-acetaminophen (NORCO/VICODIN) 5-325 MG tablet, Take 1 tablet by mouth every 4 (four) hours as needed. (Patient not taking: Reported on 11/12/2020), Disp: 10 tablet, Rfl: 0 .  ibuprofen (ADVIL,MOTRIN) 600 MG tablet, Take 1 tablet (600 mg total) by mouth every 6 (six) hours as needed. (Patient taking differently: Take 600 mg by mouth every 6 (six) hours as needed for moderate pain.), Disp: 30 tablet, Rfl: 0 .  ondansetron (ZOFRAN) 4 MG tablet, Take 1 tablet (4 mg total) by mouth every 6 (six) hours. (Patient not taking: Reported on 11/12/2020), Disp: 12 tablet, Rfl: 0 .  oxyCODONE-acetaminophen (PERCOCET/ROXICET) 5-325 MG tablet, Take 1-2 tablets by mouth every 6 (six) hours as needed for severe pain. (Patient not taking: No sig reported), Disp: 12 tablet, Rfl: 0 .  potassium chloride SA (K-DUR,KLOR-CON) 20 MEQ tablet, Take 1 tablet (20 mEq total) by mouth 2 (two) times daily. (Patient not taking: Reported on 11/12/2020), Disp: 10 tablet, Rfl: 0 .  prazosin (MINIPRESS) 2 MG capsule, TAKE 1 CAPSULE BY MOUTH AT BEDTIME FOR 7 DAYS. THEN MAY INCREASE TO TWO CAPSULES., Disp: 60 capsule, Rfl: 0 .  sertraline (ZOLOFT) 50 MG tablet, Take 1 tablet (50 mg total) by mouth daily. Take 1/2 tab 25 mg for  7 days then one whole tab., Disp: 30 tablet, Rfl: 1 .  traZODone (DESYREL) 100 MG tablet, Take 150 mg by mouth at bedtime., Disp: , Rfl:  Medication Side Effects: none  Family Medical/ Social History: Changes? No  MENTAL HEALTH EXAM:  There were no vitals taken for this visit.There is no height or weight on file to calculate BMI.  General Appearance: Casual and Neat  Eye Contact:  Good  Speech:  Normal Rate  Volume:  Normal  Mood:  Anxious and Depressed  Affect:  Appropriate  Thought Process:  Coherent  Orientation:  Full (Time, Place, and  Person)  Thought Content: Logical   Suicidal Thoughts:  No  Homicidal Thoughts:  No  Memory:  WNL  Judgement:  Good  Insight:  Good  Psychomotor Activity:  Normal  Concentration:  Concentration: Good  Recall:  Good  Fund of Knowledge: Good  Language: Good  Assets:  Desire for Improvement Resilience Social Support  ADL's:  Intact  Cognition: WNL  Prognosis:  Good    DIAGNOSES:    ICD-10-CM   1. Major depressive disorder, recurrent episode, moderate (HCC)  F33.1 buPROPion (WELLBUTRIN XL) 150 MG 24 hr tablet    busPIRone (BUSPAR) 15 MG tablet  2. Generalized anxiety disorder  F41.1 buPROPion (WELLBUTRIN XL) 150 MG 24 hr tablet    ALPRAZolam (XANAX) 1 MG tablet    busPIRone (BUSPAR) 15 MG tablet  3. PTSD (post-traumatic stress disorder)  F43.10 buPROPion (WELLBUTRIN XL) 150 MG 24 hr tablet    ALPRAZolam (XANAX) 1 MG tablet    busPIRone (BUSPAR) 15 MG tablet    Receiving Psychotherapy: No    RECOMMENDATIONS:  Will continue trazodone 150 mg DC zoloft due to undesirable side effects  Continue prazosin 2 mg capsule at bedtime. To start Wellbutrin 150 mg XL daily Will report and side effects or worsening symptoms promptly. Will follow up in 4 weeks to reassess anxiety and sleep. Recommended psychotherapy  Discussed potential benefits, risk, and side effects of benzodiazepines to include potential risk of tolerance and dependence, as well as possible drowsiness.  Advised patient not to drive if experiencing drowsiness and to take lowest possible effective dose to minimize risk of dependence and tolerance. Greater than 50% of 40 min. of face to face time with patient was spent on counseling and coordination of care. We discussed side effects and discontinuing of Zoloft. Patient was experiencing severe nausea, fatigue, and diarrhea. Discussed medication options and for patient to be aware of activating medications such as Wellbutrin. She agreed to report if symptoms of mania present.  Agreed to script of xanax to control panic attacks and severe anxiety episodes. She agreed to not take more than prescribed and to slowly back off medication if getting anxiety control with Buspar. Pt agreed to be monitored through regular visits.      Elwanda Brooklyn, NP

## 2020-12-23 DIAGNOSIS — R059 Cough, unspecified: Secondary | ICD-10-CM | POA: Diagnosis not present

## 2020-12-23 DIAGNOSIS — R0602 Shortness of breath: Secondary | ICD-10-CM | POA: Diagnosis not present

## 2020-12-23 DIAGNOSIS — J441 Chronic obstructive pulmonary disease with (acute) exacerbation: Secondary | ICD-10-CM | POA: Diagnosis not present

## 2020-12-23 DIAGNOSIS — Z885 Allergy status to narcotic agent status: Secondary | ICD-10-CM | POA: Diagnosis not present

## 2020-12-23 DIAGNOSIS — Z20822 Contact with and (suspected) exposure to covid-19: Secondary | ICD-10-CM | POA: Diagnosis not present

## 2020-12-23 DIAGNOSIS — Z720 Tobacco use: Secondary | ICD-10-CM | POA: Diagnosis not present

## 2020-12-23 DIAGNOSIS — Z79899 Other long term (current) drug therapy: Secondary | ICD-10-CM | POA: Diagnosis not present

## 2020-12-23 DIAGNOSIS — R062 Wheezing: Secondary | ICD-10-CM | POA: Diagnosis not present

## 2020-12-24 NOTE — Progress Notes (Deleted)
Cardiology Office Note:    Date:  12/24/2020   ID:  Jenna Zamora, DOB [birth year], MRN [id number]  PCP:  Lujean Amel, MD  Cardiologist:  None  Electrophysiologist:  None   Referring MD: Lujean Amel, MD   No chief complaint on file. ***  History of Present Illness:    Jenna Zamora is a 50 y.o. female with a hx of BPD, COPD, hyperlipidemia, tobacco use, rheumatoid arthritis who is referred by Dr. Dorthy Cooler for evaluation of hyperlipidemia.     Past Medical History:  Diagnosis Date  . Anemia   . Arthritis   . Bipolar 1 disorder ([place])   . Blood transfusion without reported diagnosis   . Chronic back pain   . Collagen vascular disease ([place])   . DDD (degenerative disc disease)   . Emphysema of lung ([place])   . GERD (gastroesophageal reflux disease)   . Hyperlipidemia   . Lumbar herniated disc   . Neuropathy   . Panic attacks   . Pituitary adenoma ([place])   . PTSD (post-traumatic stress disorder)   . Rheumatoid arthritis [facility])     Past Surgical History:  Procedure Laterality Date  . ABDOMINAL SURGERY    . DENTAL SURGERY    . HIP SURGERY    . NECK SURGERY    . TUBAL LIGATION      Current Medications: No outpatient medications have been marked as taking for the 12/26/20 encounter (Appointment) with Donato Heinz, MD.     Allergies:   Morphine and related, Codeine, and Tramadol   Social History   Socioeconomic History  . Marital status: Married    Spouse name: Not on file  . Number of children: Not on file  . Years of education: Not on file  . Highest education level: Not on file  Occupational History  . Not on file  Tobacco Use  . Smoking status: Current Every Day Smoker    Packs/day: 1.00    Types: Cigarettes    Start date: 07/28/2005  . Smokeless tobacco: Never Used  Vaping Use  . Vaping Use: Former  Substance and Sexual Activity  . Alcohol use: No  . Drug use: No  . Sexual activity: Yes    Partners: Male  Other Topics Concern  . Not  on file  Social History Narrative  . Not on file   Social Determinants of Health   Financial Resource Strain: Not on file  Food Insecurity: Not on file  Transportation Needs: Not on file  Physical Activity: Not on file  Stress: Not on file  Social Connections: Not on file     Family History: The patient's ***family history includes Bipolar disorder in her daughter and mother; Cancer in her maternal aunt, maternal grandmother, and mother; Heart disease in her mother.  ROS:   Please see the history of present illness.    *** All other systems reviewed and are negative.  EKGs/Labs/Other Studies Reviewed:    The following studies were reviewed today: ***  EKG:  EKG is *** ordered today.  The ekg ordered today demonstrates ***  Recent Labs: No results found for requested labs within last 8760 hours.  Recent Lipid Panel No results found for: CHOL, TRIG, HDL, CHOLHDL, VLDL, LDLCALC, LDLDIRECT  Physical Exam:    VS:  There were no vitals taken for this visit.    Wt Readings from Last 3 Encounters:  09/28/19 128 lb 15.5 oz (58.5 kg)  11/13/18 129 lb (58.5 kg)  06/13/18  135 lb (61.2 kg)     GEN: *** Well nourished, well developed in no acute distress HEENT: Normal NECK: No JVD; No carotid bruits LYMPHATICS: No lymphadenopathy CARDIAC: ***RRR, no murmurs, rubs, gallops RESPIRATORY:  Clear to auscultation without rales, wheezing or rhonchi  ABDOMEN: Soft, non-tender, non-distended MUSCULOSKELETAL:  No edema; No deformity  SKIN: Warm and dry NEUROLOGIC:  Alert and oriented x 3 PSYCHIATRIC:  Normal affect   ASSESSMENT:    No diagnosis found. PLAN:    In order of problems listed above:  1. ***   Medication Adjustments/Labs and Tests Ordered: Current medicines are reviewed at length with the patient today.  Concerns regarding medicines are outlined above.  No orders of the defined types were placed in this encounter.  No orders of the defined types were placed in  this encounter.   There are no Patient Instructions on file for this visit.   Signed, Donato Heinz, MD  12/24/2020 11:39 AM    [facility]

## 2020-12-26 ENCOUNTER — Encounter: Payer: Self-pay | Admitting: Cardiology

## 2020-12-26 ENCOUNTER — Ambulatory Visit (INDEPENDENT_AMBULATORY_CARE_PROVIDER_SITE_OTHER): Payer: Medicare Other | Admitting: Cardiology

## 2020-12-26 ENCOUNTER — Other Ambulatory Visit: Payer: Self-pay

## 2020-12-26 VITALS — BP 128/84 | HR 86 | Ht 61.0 in | Wt 139.0 lb

## 2020-12-26 DIAGNOSIS — E785 Hyperlipidemia, unspecified: Secondary | ICD-10-CM | POA: Diagnosis not present

## 2020-12-26 DIAGNOSIS — R079 Chest pain, unspecified: Secondary | ICD-10-CM | POA: Diagnosis not present

## 2020-12-26 DIAGNOSIS — J069 Acute upper respiratory infection, unspecified: Secondary | ICD-10-CM | POA: Diagnosis not present

## 2020-12-26 DIAGNOSIS — R55 Syncope and collapse: Secondary | ICD-10-CM

## 2020-12-26 DIAGNOSIS — Z720 Tobacco use: Secondary | ICD-10-CM

## 2020-12-26 NOTE — Patient Instructions (Addendum)
Medication Instructions:  Your physician recommends that you continue on your current medications as directed. Please refer to the Current Medication list given to you today.  *If you need a refill on your cardiac medications before your next appointment, please call your pharmacy*   Lab Work: Lipid today  If you have labs (blood work) drawn today and your tests are completely normal, you will receive your results only by: Marland Kitchen MyChart Message (if you have MyChart) OR . A paper copy in the mail If you have any lab test that is abnormal or we need to change your treatment, we will call you to review the results.   Testing/Procedures: Your physician has requested that you have an exercise stress myoview at [facility]. For further information please visit [email or web address]. Please follow instruction sheet, as given.  CT coronary calcium score. This test is done at [address]. This is $99 out of pocket.   Coronary CalciumScan A coronary calcium scan is an imaging test used to look for deposits of calcium and other fatty materials (plaques) in the inner lining of the blood vessels of the heart (coronary arteries). These deposits of calcium and plaques can partly clog and narrow the coronary arteries without producing any symptoms or warning signs. This puts a person at risk for a heart attack. This test can detect these deposits before symptoms develop. Tell a health care provider about:  Any allergies you have.  All medicines you are taking, including vitamins, herbs, eye drops, creams, and over-the-counter medicines.  Any problems you or family members have had with anesthetic medicines.  Any blood disorders you have.  Any surgeries you have had.  Any medical conditions you have.  Whether you are pregnant or may be pregnant. What are the risks? Generally, this is a safe procedure. However, problems may occur, including:  Harm to a pregnant woman and her  unborn baby. This test involves the use of radiation. Radiation exposure can be dangerous to a pregnant woman and her unborn baby. If you are pregnant, you generally should not have this procedure done.  Slight increase in the risk of cancer. This is because of the radiation involved in the test. What happens before the procedure? No preparation is needed for this procedure. What happens during the procedure?  You will undress and remove any jewelry around your neck or chest.  You will put on a hospital gown.  Sticky electrodes will be placed on your chest. The electrodes will be connected to an electrocardiogram (ECG) machine to record a tracing of the electrical activity of your heart.  A CT scanner will take pictures of your heart. During this time, you will be asked to lie still and hold your breath for 2-3 seconds while a picture of your heart is being taken. The procedure may vary among health care providers and hospitals. What happens after the procedure?  You can get dressed.  You can return to your normal activities.  It is up to you to get the results of your test. Ask your health care provider, or the department that is doing the test, when your results will be ready. Summary  A coronary calcium scan is an imaging test used to look for deposits of calcium and other fatty materials (plaques) in the inner lining of the blood vessels of the heart (coronary arteries).  Generally, this is a safe procedure. Tell your health care provider if you are pregnant or may be pregnant.  No preparation is needed for this procedure.  A CT scanner will take pictures of your heart.  You can return to your normal activities after the scan is done. This information is not intended to replace advice given to you by your health care provider. Make sure you discuss any questions you have with your health care provider. Document Released: 01/10/2008 Document Revised: 06/02/2016 Document Reviewed:  06/02/2016 Elsevier Interactive Patient Education  2017 [place]: At [facility], you and your health needs are our priority.  As part of our continuing mission to provide you with exceptional heart care, we have created designated Provider Care Teams.  These Care Teams include your primary Cardiologist (physician) and Advanced Practice Providers (APPs -  Physician Assistants and Nurse Practitioners) who all work together to provide you with the care you need, when you need it.  We recommend signing up for the patient portal called "MyChart".  Sign up information is provided on this After Visit Summary.  MyChart is used to connect with patients for Virtual Visits (Telemedicine).  Patients are able to view lab/test results, encounter notes, upcoming appointments, etc.  Non-urgent messages can be sent to your provider as well.   To learn more about what you can do with MyChart, go to [email or web address].    Your next appointment:   3 month(s)  The format for your next appointment:   In Person  Provider:   Oswaldo Milian, MD

## 2020-12-26 NOTE — Progress Notes (Signed)
Cardiology Office Note:    Date:  12/26/2020   ID:  Jenna Zamora, DOB 14-Jun-1971, MRN [id number]  PCP:  Lujean Amel, MD  Cardiologist:  None  Electrophysiologist:  None   Referring MD: Lujean Amel, MD   No chief complaint on file.   History of Present Illness:    Jenna Zamora is a 50 y.o. female with a hx of BPD, COPD, hyperlipidemia, tobacco use, rheumatoid arthritis who is referred by Dr. Dorthy Cooler for evaluation of hyperlipidemia.  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 Recently during a periodic check-up at [facility], they performed an echocardiogram, she is unsure of the results. On 09/11/2020, her LDL cholesterol was 123. This past sunday she went to the ED for a flare-up of COPD.  She started smoking when she was 19, and smoked 1 ppd at her peak. Intermittently she quit and restarted, but is lately trying to quit and is down to 5 cigarettes since last Saturday. Last year, she was having chest pains and shortness of breath and visited the ED. Currently, she has central chest pains and shortness of breath upon exertion and at rest. This will occur a few times a month, and she is usually associated with stress. The pain is localized under her left breast, and is a stabbing quality. Duration is typically 30 seconds. Also when she is upset, she feels some palpitations. Additionally, she has had 3 episodes of panic/anxiety, lightheadedness, shortness of breath, and full syncope. During the third episode she also noted blurry vision.  Her mother died of a heart attack at 45 years old. She denies any headaches, lower extremity edema, orthopnea or PND.   Past Medical History:  Diagnosis Date  . Anemia   . Arthritis   . Bipolar 1 disorder ([place])   . Blood transfusion without reported diagnosis   . Chronic back pain   . Collagen vascular disease ([place])   . DDD (degenerative disc disease)   . Emphysema of lung ([place])   . GERD (gastroesophageal  reflux disease)   . Hyperlipidemia   . Lumbar herniated disc   . Neuropathy   . Panic attacks   . Pituitary adenoma ([place])   . PTSD (post-traumatic stress disorder)   . Rheumatoid arthritis [facility])     Past Surgical History:  Procedure Laterality Date  . ABDOMINAL SURGERY    . DENTAL SURGERY    . HIP SURGERY    . NECK SURGERY    . TUBAL LIGATION      Current Medications: Current Meds  Medication Sig  . acetaminophen (TYLENOL) 500 MG tablet Take 500 mg by mouth every 6 (six) hours as needed for mild pain.  Marland Kitchen albuterol (VENTOLIN HFA) 108 (90 Base) MCG/ACT inhaler Inhale 1 puff into the lungs every 4 (four) hours.  . ALPRAZolam (XANAX) 1 MG tablet Take 1 tablet (1 mg total) by mouth 2 (two) times daily.  Marland Kitchen azithromycin (ZITHROMAX) 250 MG tablet Take by mouth.  Marland Kitchen buPROPion (WELLBUTRIN XL) 150 MG 24 hr tablet Take 1 tablet (150 mg total) by mouth daily.  . busPIRone (BUSPAR) 15 MG tablet Take 1/3 tablet p.o. twice daily for 1 week, then take 2/3 tablet p.o. twice daily for 1 week, then take 1 tablet p.o. twice daily  . cyclobenzaprine (FLEXERIL) 10 MG tablet Take 1 tablet (10 mg total) by mouth 2 (two) times daily as needed for muscle spasms.  Marland Kitchen doxycycline (VIBRAMYCIN) 100  MG capsule Take 1 capsule (100 mg total) by mouth 2 (two) times daily.  Marland Kitchen ibuprofen (ADVIL,MOTRIN) 600 MG tablet Take 1 tablet (600 mg total) by mouth every 6 (six) hours as needed. (Patient taking differently: Take 600 mg by mouth every 6 (six) hours as needed for moderate pain.)  . ondansetron (ZOFRAN) 4 MG tablet Take 1 tablet (4 mg total) by mouth every 6 (six) hours.  . potassium chloride SA (K-DUR,KLOR-CON) 20 MEQ tablet Take 1 tablet (20 mEq total) by mouth 2 (two) times daily.  . prazosin (MINIPRESS) 2 MG capsule TAKE 1 CAPSULE BY MOUTH AT BEDTIME FOR 7 DAYS. THEN MAY INCREASE TO TWO CAPSULES.  Marland Kitchen predniSONE (DELTASONE) 20 MG tablet 3 po daily for 2 days, then 2 po daily for 2 days, then 1 po daily for 2 days  .  rosuvastatin (CRESTOR) 5 MG tablet Take 5 mg by mouth daily.  . traZODone (DESYREL) 100 MG tablet Take 150 mg by mouth at bedtime.     Allergies:   Morphine and related, Codeine, and Tramadol   Social History   Socioeconomic History  . Marital status: Married    Spouse name: Not on file  . Number of children: Not on file  . Years of education: Not on file  . Highest education level: Not on file  Occupational History  . Not on file  Tobacco Use  . Smoking status: Current Every Day Smoker    Packs/day: 1.00    Types: Cigarettes    Start date: 07/28/2005  . Smokeless tobacco: Never Used  Vaping Use  . Vaping Use: Former  Substance and Sexual Activity  . Alcohol use: No  . Drug use: No  . Sexual activity: Yes    Partners: Male  Other Topics Concern  . Not on file  Social History Narrative  . Not on file   Social Determinants of Health   Financial Resource Strain: Not on file  Food Insecurity: Not on file  Transportation Needs: Not on file  Physical Activity: Not on file  Stress: Not on file  Social Connections: Not on file     Family History: The patient's family history includes Bipolar disorder in her daughter and mother; Cancer in her maternal aunt, maternal grandmother, and mother; Heart disease in her mother.  ROS:   Please see the history of present illness.    (+) Central chest pain, stabbing pain localized inferior to left breast (+) Shortness of breath (+) Palpitations (+) Anxiety (+) Lightheadedness (+) Syncope (+) Blurry vision (+) Neuropathy All other systems reviewed and are negative.  EKGs/Labs/Other Studies Reviewed:    The following studies were reviewed today: No prior CV studies.   EKG:   12/26/2020: Sinus Rhythm  Recent Labs: No results found for requested labs within last 8760 hours.  Recent Lipid Panel No results found for: CHOL, TRIG, HDL, CHOLHDL, VLDL, LDLCALC, LDLDIRECT  Physical Exam:    VS:  BP 128/84   Pulse 86   Ht 5'  1" (1.549 m)   Wt 139 lb (63 kg)   BMI 26.26 kg/m     Wt Readings from Last 3 Encounters:  12/26/20 139 lb (63 kg)  09/28/19 128 lb 15.5 oz (58.5 kg)  11/13/18 129 lb (58.5 kg)     GEN: Well nourished, well developed in no acute distress HEENT: Normal NECK: No JVD; No carotid bruits LYMPHATICS: No lymphadenopathy CARDIAC: RRR, no murmurs, rubs, gallops RESPIRATORY:  Expiratory wheezing.  ABDOMEN: Soft, non-tender, non-distended  MUSCULOSKELETAL:  No edema; No deformity  SKIN: Warm and dry NEUROLOGIC:  Alert and oriented x 3 PSYCHIATRIC:  Normal affect   ASSESSMENT:    1. Chest pain of uncertain etiology   2. Hyperlipidemia, unspecified hyperlipidemia type   3. Syncope and collapse   4. Tobacco use    PLAN:    Chest pain: Atypical in description but does have CAD risk factors (tobacco use, family history, hyperlipidemia).  Will evaluate for ischemia with treadmill Myoview.  Syncope: Unclear cause.  Will obtain results of recent echocardiogram from [place].  Recommend Zio patch x2 weeks to evaluate for arrhythmia  Hyperlipidemia: On 09/11/2020, her LDL cholesterol was 123, started on rosuvastatin 5 mg daily.  Will check calcium score to guide how aggressive to be in lowering cholesterol  Tobacco use: Patient counseled on the risk of tobacco use and cessation strongly encouraged.  RTC in 3 months.   Shared Decision Making/Informed Consent The risks [chest pain, shortness of breath, cardiac arrhythmias, dizziness, blood pressure fluctuations, myocardial infarction, stroke/transient ischemic attack, nausea, vomiting, allergic reaction, radiation exposure, metallic taste sensation and life-threatening complications (estimated to be 1 in 10,000)], benefits (risk stratification, diagnosing coronary artery disease, treatment guidance) and alternatives of a nuclear stress test were discussed in detail with Jenna Zamora and she agrees to proceed.      Medication Adjustments/Labs  and Tests Ordered: Current medicines are reviewed at length with the patient today.  Concerns regarding medicines are outlined above.  Orders Placed This Encounter  Procedures  . CT CARDIAC SCORING (SELF PAY ONLY)  . Lipid panel  . MYOCARDIAL PERFUSION IMAGING  . EKG 12-Lead   No orders of the defined types were placed in this encounter.   Patient Instructions  Medication Instructions:  Your physician recommends that you continue on your current medications as directed. Please refer to the Current Medication list given to you today.  *If you need a refill on your cardiac medications before your next appointment, please call your pharmacy*   Lab Work: Lipid today  If you have labs (blood work) drawn today and your tests are completely normal, you will receive your results only by: Marland Kitchen MyChart Message (if you have MyChart) OR . A paper copy in the mail If you have any lab test that is abnormal or we need to change your treatment, we will call you to review the results.   Testing/Procedures: Your physician has requested that you have an exercise stress myoview at [facility]. For further information please visit [email or web address]. Please follow instruction sheet, as given.  CT coronary calcium score. This test is done at [address]. This is $99 out of pocket.   Coronary CalciumScan A coronary calcium scan is an imaging test used to look for deposits of calcium and other fatty materials (plaques) in the inner lining of the blood vessels of the heart (coronary arteries). These deposits of calcium and plaques can partly clog and narrow the coronary arteries without producing any symptoms or warning signs. This puts a person at risk for a heart attack. This test can detect these deposits before symptoms develop. Tell a health care provider about:  Any allergies you have.  All medicines you are taking, including vitamins, herbs, eye drops, creams, and  over-the-counter medicines.  Any problems you or family members have had with anesthetic medicines.  Any blood disorders you have.  Any surgeries you have had.  Any medical conditions you have.  Whether you are  pregnant or may be pregnant. What are the risks? Generally, this is a safe procedure. However, problems may occur, including:  Harm to a pregnant woman and her unborn baby. This test involves the use of radiation. Radiation exposure can be dangerous to a pregnant woman and her unborn baby. If you are pregnant, you generally should not have this procedure done.  Slight increase in the risk of cancer. This is because of the radiation involved in the test. What happens before the procedure? No preparation is needed for this procedure. What happens during the procedure?  You will undress and remove any jewelry around your neck or chest.  You will put on a hospital gown.  Sticky electrodes will be placed on your chest. The electrodes will be connected to an electrocardiogram (ECG) machine to record a tracing of the electrical activity of your heart.  A CT scanner will take pictures of your heart. During this time, you will be asked to lie still and hold your breath for 2-3 seconds while a picture of your heart is being taken. The procedure may vary among health care providers and hospitals. What happens after the procedure?  You can get dressed.  You can return to your normal activities.  It is up to you to get the results of your test. Ask your health care provider, or the department that is doing the test, when your results will be ready. Summary  A coronary calcium scan is an imaging test used to look for deposits of calcium and other fatty materials (plaques) in the inner lining of the blood vessels of the heart (coronary arteries).  Generally, this is a safe procedure. Tell your health care provider if you are pregnant or may be pregnant.  No preparation is needed for  this procedure.  A CT scanner will take pictures of your heart.  You can return to your normal activities after the scan is done. This information is not intended to replace advice given to you by your health care provider. Make sure you discuss any questions you have with your health care provider. Document Released: 01/10/2008 Document Revised: 06/02/2016 Document Reviewed: 06/02/2016 Elsevier Interactive Patient Education  2017 [place]: At [facility], you and your health needs are our priority.  As part of our continuing mission to provide you with exceptional heart care, we have created designated Provider Care Teams.  These Care Teams include your primary Cardiologist (physician) and Advanced Practice Providers (APPs -  Physician Assistants and Nurse Practitioners) who all work together to provide you with the care you need, when you need it.  We recommend signing up for the patient portal called "MyChart".  Sign up information is provided on this After Visit Summary.  MyChart is used to connect with patients for Virtual Visits (Telemedicine).  Patients are able to view lab/test results, encounter notes, upcoming appointments, etc.  Non-urgent messages can be sent to your provider as well.   To learn more about what you can do with MyChart, go to [email or web address].    Your next appointment:   3 month(s)  The format for your next appointment:   In Person  Provider:   Oswaldo Milian, MD    [facility],acting as a scribe for Donato Heinz, MD.,have documented all relevant documentation on the behalf of Donato Heinz, MD,as directed by  Donato Heinz, MD while in the presence of Donato Heinz, MD.  I, Donato Heinz, MD, have reviewed all  documentation for this visit. The documentation on 12/26/20 for the exam, diagnosis, procedures, and orders are all accurate and complete.   Signed, Donato Heinz, MD  12/26/2020 11:16 PM    [place]

## 2020-12-27 ENCOUNTER — Telehealth: Payer: Self-pay | Admitting: *Deleted

## 2020-12-27 ENCOUNTER — Encounter: Payer: Self-pay | Admitting: *Deleted

## 2020-12-27 DIAGNOSIS — R55 Syncope and collapse: Secondary | ICD-10-CM

## 2020-12-27 LAB — LIPID PANEL
Chol/HDL Ratio: 2.3 ratio (ref 0.0–4.4)
Cholesterol, Total: 165 mg/dL (ref 100–199)
HDL: 73 mg/dL (ref >39)
LDL Chol Calc (NIH): 67 mg/dL (ref 0–99)
Triglycerides: 152 mg/dL — ABNORMAL HIGH (ref 0–149)
VLDL Cholesterol Cal: 25 mg/dL (ref 5–40)

## 2020-12-27 NOTE — Telephone Encounter (Signed)
Patient aware and verbalized understanding. Will send instructions via Mychart

## 2020-12-27 NOTE — Telephone Encounter (Signed)
-----   Message from Donato Heinz, MD sent at 12/26/2020 11:16 PM EDT ----- Regarding: Zio I forgot to mention getting a Zio x 2 weeks on her for syncope- can we order that? Thanks, Gerald Stabs

## 2020-12-28 ENCOUNTER — Ambulatory Visit (INDEPENDENT_AMBULATORY_CARE_PROVIDER_SITE_OTHER): Payer: Medicare Other

## 2020-12-28 DIAGNOSIS — R55 Syncope and collapse: Secondary | ICD-10-CM

## 2020-12-28 NOTE — Progress Notes (Unsigned)
Enrolled patient for a 14 day Zio XT  monitor to be mailed to patients home  °

## 2021-01-01 DIAGNOSIS — R55 Syncope and collapse: Secondary | ICD-10-CM

## 2021-01-02 DIAGNOSIS — H10013 Acute follicular conjunctivitis, bilateral: Secondary | ICD-10-CM | POA: Diagnosis not present

## 2021-01-03 ENCOUNTER — Other Ambulatory Visit: Payer: Self-pay | Admitting: Behavioral Health

## 2021-01-03 DIAGNOSIS — F4310 Post-traumatic stress disorder, unspecified: Secondary | ICD-10-CM

## 2021-01-04 ENCOUNTER — Other Ambulatory Visit: Payer: Self-pay | Admitting: Behavioral Health

## 2021-01-04 DIAGNOSIS — F411 Generalized anxiety disorder: Secondary | ICD-10-CM

## 2021-01-04 DIAGNOSIS — F331 Major depressive disorder, recurrent, moderate: Secondary | ICD-10-CM

## 2021-01-04 DIAGNOSIS — F4310 Post-traumatic stress disorder, unspecified: Secondary | ICD-10-CM

## 2021-01-07 ENCOUNTER — Encounter: Payer: Self-pay | Admitting: *Deleted

## 2021-01-09 ENCOUNTER — Ambulatory Visit: Payer: Medicare Other | Admitting: Behavioral Health

## 2021-01-09 ENCOUNTER — Telehealth: Payer: Self-pay | Admitting: Behavioral Health

## 2021-01-09 NOTE — Telephone Encounter (Signed)
Please review

## 2021-01-09 NOTE — Telephone Encounter (Signed)
Rx sent 

## 2021-01-09 NOTE — Telephone Encounter (Signed)
Pt called in for refill on prazosin 2mg . States she took her last two last night and won't have any for tonight. Appt 6/16. Ph: [phone]. Pharmacy [facility] [place], [place]

## 2021-01-10 ENCOUNTER — Ambulatory Visit (INDEPENDENT_AMBULATORY_CARE_PROVIDER_SITE_OTHER): Payer: Medicare Other | Admitting: Behavioral Health

## 2021-01-10 ENCOUNTER — Encounter: Payer: Self-pay | Admitting: Behavioral Health

## 2021-01-10 ENCOUNTER — Other Ambulatory Visit: Payer: Self-pay

## 2021-01-10 DIAGNOSIS — F411 Generalized anxiety disorder: Secondary | ICD-10-CM

## 2021-01-10 DIAGNOSIS — F331 Major depressive disorder, recurrent, moderate: Secondary | ICD-10-CM | POA: Diagnosis not present

## 2021-01-10 DIAGNOSIS — F4310 Post-traumatic stress disorder, unspecified: Secondary | ICD-10-CM | POA: Diagnosis not present

## 2021-01-10 MED ORDER — ALPRAZOLAM 1 MG PO TABS: 1.0000 mg | ORAL_TABLET | Freq: Two times a day (BID) | ORAL | 2 refills | Status: DC

## 2021-01-10 MED ORDER — BUPROPION HCL ER (XL) 300 MG PO TB24: 300.0000 mg | ORAL_TABLET | Freq: Every day | ORAL | 1 refills | Status: DC

## 2021-01-10 NOTE — Progress Notes (Signed)
[facility] Med Check  Patient ID: Jenna Zamora,  MRN: [id number]  PCP: Lujean Amel, MD  Date of Evaluation: 01/10/2021 Time spent:30 minutes  Chief Complaint:   HISTORY/CURRENT STATUS: HPI 50 year old patient presents to this office for follow up and medication management. She says that her moods have improved but feels that she still needs a medication adjustment. She believes that Wellbutrin is working but would like to try increasing to 300 mg XL. Patient says that her household has become even more stressful with her taking in more grandchildren. Says her son is in rehab in [place] and doing well but her daughter is pregnant and has been using Fentanyl. She is trying to get custody of her daughters children. She says she is managing stress better though and only takes xanax when the stress starts to be intolerable. She has a minium of 10 people in her home at all times assisting in providing care. She reports her anxiety today at 5/10 and depression at 2/10. Says she is sleeping better with trazodone and prazosin is helping with the nightmares.No mania present. No psychosis. No SI/HI.  Past medication failures: Abilify Zoloft  Individual Medical History/ Review of Systems: Changes? :No   Allergies: Morphine and related, Codeine, and Tramadol  Current Medications:  Current Outpatient Medications:    buPROPion (WELLBUTRIN XL) 300 MG 24 hr tablet, Take 1 tablet (300 mg total) by mouth daily., Disp: 90 tablet, Rfl: 1   acetaminophen (TYLENOL) 500 MG tablet, Take 500 mg by mouth every 6 (six) hours as needed for mild pain., Disp: , Rfl:    albuterol (VENTOLIN HFA) 108 (90 Base) MCG/ACT inhaler, Inhale 1 puff into the lungs every 4 (four) hours., Disp: , Rfl:    ALPRAZolam (XANAX) 1 MG tablet, Take 1 tablet (1 mg total) by mouth 2 (two) times daily., Disp: 60 tablet, Rfl: 2   azithromycin (ZITHROMAX) 250 MG tablet, Take by mouth., Disp: , Rfl:    busPIRone (BUSPAR) 15 MG tablet,  TAKE 1/3 TABLET BY MOUTH TWICE DAILY FOR 1 WEEK, THEN TAKE 2/3 TWICE DAILY FOR 1 WEEK, THEN TAKE 1 TWICE DAILY, Disp: 180 tablet, Rfl: 1   cyclobenzaprine (FLEXERIL) 10 MG tablet, Take 1 tablet (10 mg total) by mouth 2 (two) times daily as needed for muscle spasms., Disp: 20 tablet, Rfl: 0   doxycycline (VIBRAMYCIN) 100 MG capsule, Take 1 capsule (100 mg total) by mouth 2 (two) times daily., Disp: 20 capsule, Rfl: 0   ibuprofen (ADVIL,MOTRIN) 600 MG tablet, Take 1 tablet (600 mg total) by mouth every 6 (six) hours as needed. (Patient taking differently: Take 600 mg by mouth every 6 (six) hours as needed for moderate pain.), Disp: 30 tablet, Rfl: 0   ondansetron (ZOFRAN) 4 MG tablet, Take 1 tablet (4 mg total) by mouth every 6 (six) hours., Disp: 12 tablet, Rfl: 0   potassium chloride SA (K-DUR,KLOR-CON) 20 MEQ tablet, Take 1 tablet (20 mEq total) by mouth 2 (two) times daily., Disp: 10 tablet, Rfl: 0   prazosin (MINIPRESS) 2 MG capsule, Take 2 capsules (4 mg total) by mouth at bedtime., Disp: 60 capsule, Rfl: 0   predniSONE (DELTASONE) 20 MG tablet, 3 po daily for 2 days, then 2 po daily for 2 days, then 1 po daily for 2 days, Disp: , Rfl:    rosuvastatin (CRESTOR) 5 MG tablet, Take 5 mg by mouth daily., Disp: , Rfl:    traZODone (DESYREL) 100 MG tablet, Take 150 mg by mouth  at bedtime., Disp: , Rfl:  Medication Side Effects: none  Family Medical/ Social History: Changes? No  Labs from 5/29 and 6/1 reviewed  MENTAL HEALTH EXAM:  There were no vitals taken for this visit.There is no height or weight on file to calculate BMI.  General Appearance: Casual and Neat  Eye Contact:  Good  Speech:  Clear and Coherent  Volume:  Normal  Mood:  NA  Affect:  Appropriate  Thought Process:  Coherent  Orientation:  Full (Time, Place, and Person)  Thought Content: Logical   Suicidal Thoughts:  No  Homicidal Thoughts:  No  Memory:  WNL  Judgement:  Good  Insight:  Good  Psychomotor Activity:  Normal   Concentration:  Concentration: Good  Recall:  Good  Fund of Knowledge: Good  Language: Good  Assets:  Desire for Improvement  ADL's:  Intact  Cognition: WNL  Prognosis:  Good    DIAGNOSES:    ICD-10-CM   1. Generalized anxiety disorder  F41.1 buPROPion (WELLBUTRIN XL) 300 MG 24 hr tablet    ALPRAZolam (XANAX) 1 MG tablet    2. Major depressive disorder, recurrent episode, moderate (HCC)  F33.1 buPROPion (WELLBUTRIN XL) 300 MG 24 hr tablet    3. PTSD (post-traumatic stress disorder)  F43.10 buPROPion (WELLBUTRIN XL) 300 MG 24 hr tablet    ALPRAZolam (XANAX) 1 MG tablet      Receiving Psychotherapy: No    RECOMMENDATIONS:   Continue Xanax 1 mg tablet twice daily prn Will continue trazodone 150 mg  Continue prazosin 2 mg capsule at bedtime. Will increase Wellbutrin  to 300 mg XL daily Will report and side effects or worsening symptoms promptly. Will follow up in 3 months to reassess Recommended psychotherapy but patient says that she is not in the position to receive Discussed potential benefits, risk, and side effects of benzodiazepines to include potential risk of tolerance and dependence, as well as possible drowsiness.  Advised patient not to drive if experiencing drowsiness and to take lowest possible effective dose to minimize risk of dependence and tolerance. Greater than 50% of 30 min. of face to face time with patient was spent on counseling and coordination of care. Discussed medication options and for patient to be aware of activating medications such as Wellbutrin when increasing dose. She agreed to report if symptoms of mania present. Agreed to script of xanax to control panic attacks and severe anxiety episodes. She agreed to not take more than prescribed and to slowly back off medication if getting anxiety control with Buspar. Discussed with patient to make sure PCP reviews labs from 5/29 with her.  Pt agreed to be monitored through regular visits.    Elwanda Brooklyn, NP

## 2021-01-17 ENCOUNTER — Telehealth (HOSPITAL_COMMUNITY): Payer: Self-pay | Admitting: *Deleted

## 2021-01-17 NOTE — Telephone Encounter (Signed)
Patient given detailed instructions per Myocardial Perfusion Study Information Sheet for the test on 01/23/21 Patient notified to arrive 15 minutes early and that it is imperative to arrive on time for appointment to keep from having the test rescheduled.  If you need to cancel or reschedule your appointment, please call the office within 24 hours of your appointment. . Patient verbalized understanding. Jenna Zamora   

## 2021-01-21 DIAGNOSIS — R55 Syncope and collapse: Secondary | ICD-10-CM | POA: Diagnosis not present

## 2021-01-23 ENCOUNTER — Ambulatory Visit (HOSPITAL_COMMUNITY): Payer: Medicare Other | Attending: Cardiovascular Disease

## 2021-01-23 ENCOUNTER — Ambulatory Visit (INDEPENDENT_AMBULATORY_CARE_PROVIDER_SITE_OTHER)
Admission: RE | Admit: 2021-01-23 | Discharge: 2021-01-23 | Disposition: A | Discharge status: Home / Self Care | Payer: Self-pay | Source: Ambulatory Visit | Attending: Cardiology | Admitting: Cardiology

## 2021-01-23 ENCOUNTER — Other Ambulatory Visit: Payer: Self-pay

## 2021-01-23 DIAGNOSIS — R079 Chest pain, unspecified: Secondary | ICD-10-CM

## 2021-01-23 LAB — MYOCARDIAL PERFUSION IMAGING
Estimated workload: 7 METS
Exercise duration (min): 5 min
Exercise duration (sec): 30 s
LV dias vol: 70 mL (ref 46–106)
LV sys vol: 30 mL
MPHR: 171 {beats}/min
Peak HR: 181 {beats}/min
Percent HR: 105 %
Rest HR: 97 {beats}/min
SDS: 2
SRS: 0
SSS: 2
TID: 0.96

## 2021-01-23 MED ORDER — TECHNETIUM TC 99M TETROFOSMIN IV KIT
10.6000 | PACK | Freq: Once | INTRAVENOUS | Status: AC | PRN
Start: 2021-01-23 — End: 2021-01-23
  Administered 2021-01-23: 10.6 via INTRAVENOUS
  Filled 2021-01-23: qty 11

## 2021-01-23 MED ORDER — TECHNETIUM TC 99M TETROFOSMIN IV KIT
32.4000 | PACK | Freq: Once | INTRAVENOUS | Status: AC | PRN
  Administered 2021-01-23: 32.4 via INTRAVENOUS
  Filled 2021-01-23: qty 33

## 2021-01-31 ENCOUNTER — Other Ambulatory Visit: Payer: Self-pay | Admitting: Behavioral Health

## 2021-01-31 DIAGNOSIS — F4310 Post-traumatic stress disorder, unspecified: Secondary | ICD-10-CM

## 2021-02-04 NOTE — Telephone Encounter (Signed)
Last filled 6/15

## 2021-02-10 ENCOUNTER — Other Ambulatory Visit: Payer: Self-pay | Admitting: Behavioral Health

## 2021-02-10 DIAGNOSIS — F4310 Post-traumatic stress disorder, unspecified: Secondary | ICD-10-CM

## 2021-02-15 ENCOUNTER — Encounter: Payer: Self-pay | Admitting: *Deleted

## 2021-03-21 ENCOUNTER — Other Ambulatory Visit: Payer: Self-pay | Admitting: Behavioral Health

## 2021-03-21 DIAGNOSIS — F4310 Post-traumatic stress disorder, unspecified: Secondary | ICD-10-CM

## 2021-03-25 ENCOUNTER — Other Ambulatory Visit: Payer: Self-pay

## 2021-03-25 ENCOUNTER — Emergency Department (HOSPITAL_COMMUNITY)
Admission: EM | Admit: 2021-03-25 | Discharge: 2021-03-25 | Disposition: A | Discharge status: Home / Self Care | Payer: Medicare Other | Attending: Emergency Medicine | Admitting: Emergency Medicine

## 2021-03-25 ENCOUNTER — Encounter (HOSPITAL_COMMUNITY): Payer: Self-pay | Admitting: *Deleted

## 2021-03-25 ENCOUNTER — Emergency Department (HOSPITAL_COMMUNITY): Payer: Medicare Other

## 2021-03-25 DIAGNOSIS — S39012A Strain of muscle, fascia and tendon of lower back, initial encounter: Secondary | ICD-10-CM

## 2021-03-25 DIAGNOSIS — Y92331 Roller skating rink as the place of occurrence of the external cause: Secondary | ICD-10-CM | POA: Insufficient documentation

## 2021-03-25 DIAGNOSIS — S66912A Strain of unspecified muscle, fascia and tendon at wrist and hand level, left hand, initial encounter: Secondary | ICD-10-CM | POA: Diagnosis not present

## 2021-03-25 DIAGNOSIS — W010XXA Fall on same level from slipping, tripping and stumbling without subsequent striking against object, initial encounter: Secondary | ICD-10-CM

## 2021-03-25 DIAGNOSIS — F17210 Nicotine dependence, cigarettes, uncomplicated: Secondary | ICD-10-CM | POA: Insufficient documentation

## 2021-03-25 DIAGNOSIS — M545 Low back pain, unspecified: Secondary | ICD-10-CM | POA: Diagnosis not present

## 2021-03-25 DIAGNOSIS — Y9351 Activity, roller skating (inline) and skateboarding: Secondary | ICD-10-CM | POA: Diagnosis not present

## 2021-03-25 DIAGNOSIS — S63502A Unspecified sprain of left wrist, initial encounter: Secondary | ICD-10-CM | POA: Diagnosis not present

## 2021-03-25 DIAGNOSIS — Z043 Encounter for examination and observation following other accident: Secondary | ICD-10-CM | POA: Diagnosis not present

## 2021-03-25 DIAGNOSIS — M25532 Pain in left wrist: Secondary | ICD-10-CM | POA: Diagnosis not present

## 2021-03-25 NOTE — ED Triage Notes (Signed)
Pt fell while [place] skating rink last night.  C/o lower back and left wrist.  Denies hitting her head, tried to catch herself with left hand.

## 2021-03-25 NOTE — ED Provider Notes (Signed)
[facility] EMERGENCY DEPARTMENT Provider Note   CSN: [id number] Arrival date & time: 03/25/21  1857     History Chief Complaint  Patient presents with   Fall    Jenna Zamora is a 50 y.o. female.  Patient with mechanical fall at skating risk last pm. [place] onto bottom and tried to brace fall with left wrist. Dull pain to low back and wrist - symptoms acute onset post fall, moderate, dull, non radiating. No loc. No head injury or headache. No neck pain. No radicular pain. No numbness or weakness. No loss of normal functional ability. Is right hand dominant. Skin is intact. Denies other pain or injury. No anticoag use.   The history is provided by the patient and medical records.  Fall Pertinent negatives include no chest pain, no abdominal pain, no headaches and no shortness of breath.      Past Medical History:  Diagnosis Date   Anemia    Arthritis    Bipolar 1 disorder ([place])    Blood transfusion without reported diagnosis    Chronic back pain    Collagen vascular disease ([place])    DDD (degenerative disc disease)    Emphysema of lung (HCC)    GERD (gastroesophageal reflux disease)    Hyperlipidemia    Lumbar herniated disc    Neuropathy    Panic attacks    Pituitary adenoma (HCC)    PTSD (post-traumatic stress disorder)    Rheumatoid arthritis ([place])     Patient Active Problem List   Diagnosis Date Noted   Dyslipidemia 10/31/2020   Family history of early CAD 10/31/2020   Herniated vertebral disc 04/20/2013   DDD (degenerative disc disease) 04/20/2013   Rheumatoid arthritis of hand ([place]) 04/20/2013   PTSD (post-traumatic stress disorder) 04/20/2013   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   Insomnia 04/20/2013   GAD (generalized anxiety disorder) 04/20/2013   Chronic pain syndrome 04/20/2013   Incontinence 04/20/2013    Past Surgical History:  Procedure Laterality Date   ABDOMINAL SURGERY     DENTAL SURGERY     HIP SURGERY     NECK SURGERY     TUBAL LIGATION        OB History     Gravida  4   Para  3   Term  3   Preterm      AB  1   Living         SAB  1   IAB      Ectopic      Multiple      Live Births              Family History  Problem Relation Age of Onset   Cancer Mother        ovarian   Heart disease Mother    Bipolar disorder Mother    Cancer Maternal Aunt        breast   Cancer Maternal Grandmother        ovarian   Bipolar disorder Daughter     Social History   Tobacco Use   Smoking status: Every Day    Packs/day: 1.00    Types: Cigarettes    Start date: 07/28/2005   Smokeless tobacco: Never  Vaping Use   Vaping Use: Former  Substance Use Topics   Alcohol use: No   Drug use: No    Home Medications Prior to Admission medications   Medication Sig Start Date End Date Taking? Authorizing Provider  acetaminophen (  TYLENOL) 500 MG tablet Take 500 mg by mouth every 6 (six) hours as needed for mild pain. Patient not taking: Reported on 01/10/2021    [provider]  albuterol (VENTOLIN HFA) 108 (90 Base) MCG/ACT inhaler Inhale 1 puff into the lungs every 4 (four) hours. 12/21/20   [provider]  ALPRAZolam Duanne Moron) 1 MG tablet Take 1 tablet (1 mg total) by mouth 2 (two) times daily. 01/10/21   Elwanda Brooklyn, NP  azithromycin (ZITHROMAX) 250 MG tablet Take by mouth. 12/23/20   [provider]  buPROPion (WELLBUTRIN XL) 300 MG 24 hr tablet Take 1 tablet (300 mg total) by mouth daily. 01/10/21   White, Louanna Raw, NP  busPIRone (BUSPAR) 15 MG tablet TAKE 1/3 TABLET BY MOUTH TWICE DAILY FOR 1 WEEK, THEN TAKE 2/3 TWICE DAILY FOR 1 WEEK, THEN TAKE 1 TWICE DAILY 01/09/21   Elwanda Brooklyn, NP  cyclobenzaprine (FLEXERIL) 10 MG tablet Take 1 tablet (10 mg total) by mouth 2 (two) times daily as needed for muscle spasms. Patient not taking: Reported on 01/10/2021 05/31/18   Frederica Kuster, PA-C  doxycycline (VIBRAMYCIN) 100 MG capsule Take 1 capsule (100 mg total) by mouth 2 (two) times  daily. Patient not taking: Reported on 01/10/2021 11/13/18   Evalee Jefferson, PA-C  ibuprofen (ADVIL,MOTRIN) 600 MG tablet Take 1 tablet (600 mg total) by mouth every 6 (six) hours as needed. Patient taking differently: Take 600 mg by mouth every 6 (six) hours as needed for moderate pain. 05/31/18   Law, Bea Graff, PA-C  ondansetron (ZOFRAN) 4 MG tablet Take 1 tablet (4 mg total) by mouth every 6 (six) hours. 06/13/18   Triplett, Tammy, PA-C  potassium chloride SA (K-DUR,KLOR-CON) 20 MEQ tablet Take 1 tablet (20 mEq total) by mouth 2 (two) times daily. 06/13/18   Triplett, Tammy, PA-C  prazosin (MINIPRESS) 2 MG capsule TAKE 2 CAPSULES (4 MG TOTAL) BY MOUTH AT BEDTIME. 03/21/21   Elwanda Brooklyn, NP  predniSONE (DELTASONE) 20 MG tablet 3 po daily for 2 days, then 2 po daily for 2 days, then 1 po daily for 2 days 12/23/20   [provider]  rosuvastatin (CRESTOR) 5 MG tablet Take 5 mg by mouth daily. 10/03/20   [provider]  traZODone (DESYREL) 100 MG tablet Take 150 mg by mouth at bedtime. 12/25/15   [provider]    Allergies    Morphine and related, Codeine, and Tramadol  Review of Systems   Review of Systems  Constitutional:  Negative for fever.  Respiratory:  Negative for shortness of breath.   Cardiovascular:  Negative for chest pain.  Gastrointestinal:  Negative for abdominal pain.  Musculoskeletal:  Positive for back pain.       Left wrist injury.   Skin:  Negative for wound.  Neurological:  Negative for weakness, numbness and headaches.   Physical Exam Updated Vital Signs BP 122/81 (BP Location: Right Arm)   Pulse 84   Temp 98.3 F (36.8 C) (Oral)   Resp 14   Ht 1.549 m ('5\' 1"'$ )   Wt 65.3 kg   SpO2 98%   BMI 27.21 kg/m   Physical Exam Vitals and nursing note reviewed.  Constitutional:      Appearance: Normal appearance. She is well-developed.  HENT:     Head: Atraumatic.     Nose: Nose normal.     Mouth/Throat:     Mouth: Mucous membranes are  moist.  Eyes:  General: No scleral icterus.    Conjunctiva/sclera: Conjunctivae normal.     Pupils: Pupils are equal, round, and reactive to light.  Neck:     Trachea: No tracheal deviation.  Cardiovascular:     Rate and Rhythm: Normal rate.     Pulses: Normal pulses.  Pulmonary:     Effort: Pulmonary effort is normal. No respiratory distress.  Abdominal:     General: There is no distension.  Genitourinary:    Comments: No cva tenderness.  Musculoskeletal:        General: No swelling.     Cervical back: Normal range of motion and neck supple. No rigidity. No muscular tenderness.     Comments: Tenderness distal left radius. No focal scaphoid tenderness/pain. Radial pulse 2+. Hand nvi. No other focal bony tenderness noted. Lumbar tenderness, otherwise, CTLS spine, non tender, aligned, no step off.   Skin:    General: Skin is warm and dry.     Findings: No rash.  Neurological:     Mental Status: She is alert.     Comments: Alert, speech normal. GCS 15. Motor/sens grossly intact bil. Steady gait.   Psychiatric:        Mood and Affect: Mood normal.    ED Results / Procedures / Treatments   Labs (all labs ordered are listed, but only abnormal results are displayed) Labs Reviewed - No data to display  EKG None  Radiology DG Lumbar Spine Complete  Result Date: 03/25/2021 CLINICAL DATA:  Status post fall EXAM: LUMBAR SPINE - COMPLETE 4+ VIEW COMPARISON:  X-ray lumbar spine 05/31/2018 FINDINGS: Five non-rib-bearing lumbar vertebral bodies. Similar-appearing mild chronic compression fracture of the L1 vertebral body. There is no evidence of lumbar spine fracture. Alignment is normal. Intervertebral disc spaces are maintained. IMPRESSION: No acute displaced fracture or traumatic listhesis of the lumbar spine. Electronically Signed   By: Iven Finn M.D.   On: 03/25/2021 19:46   DG Wrist Complete Left  Result Date: 03/25/2021 CLINICAL DATA:  Status post fall EXAM: LEFT WRIST -  COMPLETE 3+ VIEW COMPARISON:  None. FINDINGS: There is no evidence of fracture or dislocation. There is no evidence of arthropathy or other focal bone abnormality. Soft tissues are unremarkable. IMPRESSION: Negative. Electronically Signed   By: Iven Finn M.D.   On: 03/25/2021 19:46    Procedures Procedures   Medications Ordered in ED Medications - No data to display  ED Course  I have reviewed the triage vital signs and the nursing notes.  Pertinent labs & imaging results that were available during my care of the patient were reviewed by me and considered in my medical decision making (see chart for details).    MDM Rules/Calculators/A&P                           Xrays.  Ibuprofen po.   Pt has wrist splint.   Pt appears stable for d/c.    Final Clinical Impression(s) / ED Diagnoses Final diagnoses:  None    Rx / DC Orders ED Discharge Orders     None        Lajean Saver, MD 03/25/21 1954

## 2021-03-25 NOTE — ED Notes (Signed)
Pt provided discharge instructions and prescription information. Pt was given the opportunity to ask questions and questions were answered. Discharge signature not obtained in the setting of the COVID-19 pandemic in order to reduce high touch surfaces.  ° °

## 2021-03-25 NOTE — Discharge Instructions (Signed)
It was our pleasure to provide your ER care today - we hope that you feel better.  Wear wrist splint for comfort/support for the next few days.   Icepack to sore area.   Take acetaminophen and/or ibuprofen as need for pain.  Follow up with primary care doctor in 1-2 weeks if symptoms fail to improve/resolve.  Return to ER if worse, new/severe pain, or other concern.

## 2021-04-08 ENCOUNTER — Other Ambulatory Visit: Payer: Self-pay | Admitting: Behavioral Health

## 2021-04-08 DIAGNOSIS — F4310 Post-traumatic stress disorder, unspecified: Secondary | ICD-10-CM

## 2021-04-08 NOTE — Progress Notes (Deleted)
Cardiology Office Note:    Date:  04/08/2021   ID:  Jenna Zamora, DOB Jan 31, 1971, MRN [id number]  PCP:  Lujean Amel, MD  Cardiologist:  None  Electrophysiologist:  None   Referring MD: Lujean Amel, MD   No chief complaint on file.   History of Present Illness:    Jenna Zamora is a 50 y.o. female with a hx of sarcoma, BPD, COPD, hyperlipidemia, tobacco use, rheumatoid arthritis who presents for follow-up.  She was referred by Dr. Dorthy Cooler for evaluation of hyperlipidemia, initially seen on 12/26/2020.  She reported having chest pain and shortness of breath.  In addition has been having palpitations and syncopal episodes.  Zio patch x14 days on 01/21/2021 showed no significant arrhythmias.  Exercise Myoview on 01/23/2021 showed normal perfusion, EF 57%, average functional capacity (7 METS), episode of SVT in recovery with heart rate up to 180 BPM that self terminated.  Calcium score on 01/23/2021 was 0.  Since last clinic visit,   Past Medical History:  Diagnosis Date   Anemia    Arthritis    Bipolar 1 disorder ([place])    Blood transfusion without reported diagnosis    Chronic back pain    Collagen vascular disease (HCC)    DDD (degenerative disc disease)    Emphysema of lung (HCC)    GERD (gastroesophageal reflux disease)    Hyperlipidemia    Lumbar herniated disc    Neuropathy    Panic attacks    Pituitary adenoma (HCC)    PTSD (post-traumatic stress disorder)    Rheumatoid arthritis ([place])     Past Surgical History:  Procedure Laterality Date   ABDOMINAL SURGERY     DENTAL SURGERY     HIP SURGERY     NECK SURGERY     TUBAL LIGATION      Current Medications: No outpatient medications have been marked as taking for the 04/09/21 encounter (Appointment) with Donato Heinz, MD.     Allergies:   Morphine and related, Codeine, and Tramadol   Social History   Socioeconomic History   Marital status: Married    Spouse name: Not on file   Number of  children: Not on file   Years of education: Not on file   Highest education level: Not on file  Occupational History   Not on file  Tobacco Use   Smoking status: Every Day    Packs/day: 1.00    Types: Cigarettes    Start date: 07/28/2005   Smokeless tobacco: Never  Vaping Use   Vaping Use: Former  Substance and Sexual Activity   Alcohol use: No   Drug use: No   Sexual activity: Yes    Partners: Male  Other Topics Concern   Not on file  Social History Narrative   Not on file   Social Determinants of Health   Financial Resource Strain: Not on file  Food Insecurity: Not on file  Transportation Needs: Not on file  Physical Activity: Not on file  Stress: Not on file  Social Connections: Not on file     Family History: The patient's family history includes Bipolar disorder in her daughter and mother; Cancer in her maternal aunt, maternal grandmother, and mother; Heart disease in her mother.  ROS:   Please see the history of present illness.    (+) Central chest pain, stabbing pain localized inferior to left breast (+) Shortness of breath (+) Palpitations (+) Anxiety (+) Lightheadedness (+) Syncope (+) Blurry vision (+) Neuropathy  All other systems reviewed and are negative.  EKGs/Labs/Other Studies Reviewed:    The following studies were reviewed today: No prior CV studies.   EKG:   12/26/2020: Sinus Rhythm  Recent Labs: No results found for requested labs within last 8760 hours.  Recent Lipid Panel    Component Value Date/Time   CHOL 165 12/26/2020 1514   TRIG 152 (H) 12/26/2020 1514   HDL 73 12/26/2020 1514   CHOLHDL 2.3 12/26/2020 1514   LDLCALC 67 12/26/2020 1514    Physical Exam:    VS:  There were no vitals taken for this visit.    Wt Readings from Last 3 Encounters:  03/25/21 144 lb (65.3 kg)  01/23/21 139 lb (63 kg)  12/26/20 139 lb (63 kg)     GEN: Well nourished, well developed in no acute distress HEENT: Normal NECK: No JVD; No carotid  bruits LYMPHATICS: No lymphadenopathy CARDIAC: RRR, no murmurs, rubs, gallops RESPIRATORY:  Expiratory wheezing.  ABDOMEN: Soft, non-tender, non-distended MUSCULOSKELETAL:  No edema; No deformity  SKIN: Warm and dry NEUROLOGIC:  Alert and oriented x 3 PSYCHIATRIC:  Normal affect   ASSESSMENT:    No diagnosis found.  PLAN:    Chest pain: Atypical in description.   Exercise Myoview on 01/23/2021 showed normal perfusion, EF 57%, average functional capacity (7 METS), episode of SVT in recovery with heart rate up to 180 BPM that self terminated.  Calcium score on 01/23/2021 was 0.  Syncope: Unclear cause.  Will obtain results of recent echocardiogram from [place].  Zio patch x14 days on 01/21/2021 showed no significant arrhythmias.  Hyperlipidemia: On 09/11/2020, her LDL cholesterol was 123, started on rosuvastatin 5 mg daily.  Calcium score on 01/23/2021 was 0.  Tobacco use: Patient counseled on the risk of tobacco use and cessation strongly encouraged.  RTC in***  Medication Adjustments/Labs and Tests Ordered: Current medicines are reviewed at length with the patient today.  Concerns regarding medicines are outlined above.  No orders of the defined types were placed in this encounter.  No orders of the defined types were placed in this encounter.   There are no Patient Instructions on file for this visit.     Signed, Donato Heinz, MD  04/08/2021 11:11 PM    [facility]

## 2021-04-09 ENCOUNTER — Ambulatory Visit: Payer: Medicare Other | Admitting: Cardiology

## 2021-04-09 NOTE — Telephone Encounter (Signed)
Last filled 03/26/21

## 2021-04-11 ENCOUNTER — Ambulatory Visit: Payer: Medicare Other | Admitting: Behavioral Health

## 2021-04-15 ENCOUNTER — Ambulatory Visit (INDEPENDENT_AMBULATORY_CARE_PROVIDER_SITE_OTHER): Payer: Medicare Other | Admitting: Behavioral Health

## 2021-04-15 ENCOUNTER — Encounter: Payer: Self-pay | Admitting: Behavioral Health

## 2021-04-15 ENCOUNTER — Other Ambulatory Visit: Payer: Self-pay

## 2021-04-15 DIAGNOSIS — F4310 Post-traumatic stress disorder, unspecified: Secondary | ICD-10-CM

## 2021-04-15 DIAGNOSIS — F5105 Insomnia due to other mental disorder: Secondary | ICD-10-CM

## 2021-04-15 DIAGNOSIS — F331 Major depressive disorder, recurrent, moderate: Secondary | ICD-10-CM

## 2021-04-15 DIAGNOSIS — F99 Mental disorder, not otherwise specified: Secondary | ICD-10-CM

## 2021-04-15 DIAGNOSIS — F411 Generalized anxiety disorder: Secondary | ICD-10-CM | POA: Diagnosis not present

## 2021-04-15 MED ORDER — BUSPIRONE HCL 15 MG PO TABS: ORAL_TABLET | ORAL | 2 refills | Status: DC

## 2021-04-15 MED ORDER — BUPROPION HCL ER (XL) 300 MG PO TB24: 300.0000 mg | ORAL_TABLET | Freq: Every day | ORAL | 2 refills | Status: DC

## 2021-04-15 MED ORDER — ALPRAZOLAM 1 MG PO TABS: 1.0000 mg | ORAL_TABLET | Freq: Two times a day (BID) | ORAL | 3 refills | Status: DC

## 2021-04-15 MED ORDER — PRAZOSIN HCL 2 MG PO CAPS: 4.0000 mg | ORAL_CAPSULE | Freq: Every day | ORAL | 3 refills | Status: DC

## 2021-04-15 MED ORDER — TRAZODONE HCL 100 MG PO TABS: 150.0000 mg | ORAL_TABLET | Freq: Every day | ORAL | 3 refills | Status: DC

## 2021-04-15 NOTE — Progress Notes (Signed)
[facility]  Patient ID: Jenna Zamora,  MRN: [id number]  PCP: Lujean Amel, MD  Date of Evaluation: 04/15/2021 Time spent:20 minutes  Chief Complaint:  Chief Complaint   Depression; Anxiety; Medication Refill; Follow-up; Trauma     HISTORY/CURRENT STATUS: HPI 50  year old patient presents to this office for follow up and medication management. She says still a lot going on with family but recently is helping with new grandchild.  Patient says that her household has become even more stressful with her taking in more grandchildren. Says her daughter has been clean from drugs since pregnant and baby born. She says she is managing stress better though and only takes xanax when the stress starts to be intolerable. She does not want to change medications at this time. She reports her anxiety today at 3/10 and depression at 2/10. Says she is sleeping better with trazodone and prazosin is helping with the nightmares. No mania present. No psychosis. No SI/HI.   Past medication failures: Abilify Zoloft      Individual Medical History/ Review of Systems: Changes? :No   Allergies: Morphine and related, Codeine, and Tramadol  Current Medications:  Current Outpatient Medications:    acetaminophen (TYLENOL) 500 MG tablet, Take 500 mg by mouth every 6 (six) hours as needed for mild pain. (Patient not taking: Reported on 01/10/2021), Disp: , Rfl:    albuterol (VENTOLIN HFA) 108 (90 Base) MCG/ACT inhaler, Inhale 1 puff into the lungs every 4 (four) hours., Disp: , Rfl:    ALPRAZolam (XANAX) 1 MG tablet, Take 1 tablet (1 mg total) by mouth 2 (two) times daily., Disp: 60 tablet, Rfl: 3   azithromycin (ZITHROMAX) 250 MG tablet, Take by mouth., Disp: , Rfl:    buPROPion (WELLBUTRIN XL) 300 MG 24 hr tablet, Take 1 tablet (300 mg total) by mouth daily., Disp: 90 tablet, Rfl: 2   busPIRone (BUSPAR) 15 MG tablet, Take two tablets daily., Disp: 180 tablet, Rfl: 2   cyclobenzaprine  (FLEXERIL) 10 MG tablet, Take 1 tablet (10 mg total) by mouth 2 (two) times daily as needed for muscle spasms. (Patient not taking: Reported on 01/10/2021), Disp: 20 tablet, Rfl: 0   doxycycline (VIBRAMYCIN) 100 MG capsule, Take 1 capsule (100 mg total) by mouth 2 (two) times daily. (Patient not taking: Reported on 01/10/2021), Disp: 20 capsule, Rfl: 0   ibuprofen (ADVIL,MOTRIN) 600 MG tablet, Take 1 tablet (600 mg total) by mouth every 6 (six) hours as needed. (Patient taking differently: Take 600 mg by mouth every 6 (six) hours as needed for moderate pain.), Disp: 30 tablet, Rfl: 0   ondansetron (ZOFRAN) 4 MG tablet, Take 1 tablet (4 mg total) by mouth every 6 (six) hours., Disp: 12 tablet, Rfl: 0   potassium chloride SA (K-DUR,KLOR-CON) 20 MEQ tablet, Take 1 tablet (20 mEq total) by mouth 2 (two) times daily., Disp: 10 tablet, Rfl: 0   prazosin (MINIPRESS) 2 MG capsule, Take 2 capsules (4 mg total) by mouth at bedtime., Disp: 60 capsule, Rfl: 3   predniSONE (DELTASONE) 20 MG tablet, 3 po daily for 2 days, then 2 po daily for 2 days, then 1 po daily for 2 days, Disp: , Rfl:    rosuvastatin (CRESTOR) 5 MG tablet, Take 5 mg by mouth daily., Disp: , Rfl:    traZODone (DESYREL) 100 MG tablet, Take 1.5 tablets (150 mg total) by mouth at bedtime., Disp: 45 tablet, Rfl: 3 Medication Side Effects: none  Family Medical/ Social History: Changes? No  MENTAL HEALTH EXAM:  There were no vitals taken for this visit.There is no height or weight on file to calculate BMI.  General Appearance: Casual  Eye Contact:  Good  Speech:  Clear and Coherent  Volume:  Normal  Mood:  Angry  Affect:  Appropriate  Thought Process:   normal  Orientation:  Full (Time, Place, and Person)  Thought Content: Logical   Suicidal Thoughts:  No  Homicidal Thoughts:  No  Memory:  WNL  Judgement:  Good  Insight:  Good  Psychomotor Activity:  Normal  Concentration:  Concentration: Good  Recall:  Good  Fund of Knowledge: Good   Language: Good  Assets:  Desire for Improvement  ADL's:  Intact  Cognition: WNL  Prognosis:  Good    DIAGNOSES:    ICD-10-CM   1. Insomnia due to other mental disorder  F51.05 traZODone (DESYREL) 100 MG tablet   F99     2. Generalized anxiety disorder  F41.1 ALPRAZolam (XANAX) 1 MG tablet    buPROPion (WELLBUTRIN XL) 300 MG 24 hr tablet    busPIRone (BUSPAR) 15 MG tablet    traZODone (DESYREL) 100 MG tablet    3. Major depressive disorder, recurrent episode, moderate (HCC)  F33.1 buPROPion (WELLBUTRIN XL) 300 MG 24 hr tablet    busPIRone (BUSPAR) 15 MG tablet    traZODone (DESYREL) 100 MG tablet    4. PTSD (post-traumatic stress disorder)  F43.10 prazosin (MINIPRESS) 2 MG capsule      Receiving Psychotherapy: Yes    RECOMMENDATIONS:   Continue Xanax 1 mg tablet twice daily prn Will continue trazodone 150 mg  Continue prazosin 2 mg capsule at bedtime. Will increase Wellbutrin  to 300 mg XL daily Will report and side effects or worsening symptoms promptly. Will follow up in 3 months to reassess Recommended psychotherapy but patient says that she is not in the position to receive Discussed potential benefits, risk, and side effects of benzodiazepines to include potential risk of tolerance and dependence, as well as possible drowsiness.  Advised patient not to drive if experiencing drowsiness and to take lowest possible effective dose to minimize risk of dependence and tolerance. Greater than 50% of 30 min. of face to face time with patient was spent on counseling and coordination of care. Discussed medication options and for patient to be aware of activating medications such as Wellbutrin when increasing dose. She agreed to report if symptoms of mania present. Agreed to script of xanax to control panic attacks and severe anxiety episodes. She agreed to not take more than prescribed and to slowly back off medication if getting anxiety control with Buspar.  Reviewed PDMP        Elwanda Brooklyn, NP

## 2021-05-13 ENCOUNTER — Other Ambulatory Visit: Payer: Self-pay | Admitting: Behavioral Health

## 2021-05-13 DIAGNOSIS — F4310 Post-traumatic stress disorder, unspecified: Secondary | ICD-10-CM

## 2021-05-14 NOTE — Telephone Encounter (Signed)
Last filled 9/26

## 2021-05-23 DIAGNOSIS — J0100 Acute maxillary sinusitis, unspecified: Secondary | ICD-10-CM | POA: Diagnosis not present

## 2021-05-23 DIAGNOSIS — R059 Cough, unspecified: Secondary | ICD-10-CM | POA: Diagnosis not present

## 2021-06-12 DIAGNOSIS — G4700 Insomnia, unspecified: Secondary | ICD-10-CM | POA: Diagnosis not present

## 2021-07-31 ENCOUNTER — Ambulatory Visit: Payer: Medicare Other | Admitting: Behavioral Health

## 2021-08-01 ENCOUNTER — Other Ambulatory Visit: Payer: Self-pay

## 2021-08-01 ENCOUNTER — Encounter: Payer: Self-pay | Admitting: Behavioral Health

## 2021-08-01 ENCOUNTER — Ambulatory Visit (INDEPENDENT_AMBULATORY_CARE_PROVIDER_SITE_OTHER): Payer: Medicare Other | Admitting: Behavioral Health

## 2021-08-01 DIAGNOSIS — F99 Mental disorder, not otherwise specified: Secondary | ICD-10-CM

## 2021-08-01 DIAGNOSIS — F411 Generalized anxiety disorder: Secondary | ICD-10-CM

## 2021-08-01 DIAGNOSIS — F331 Major depressive disorder, recurrent, moderate: Secondary | ICD-10-CM

## 2021-08-01 DIAGNOSIS — F4310 Post-traumatic stress disorder, unspecified: Secondary | ICD-10-CM | POA: Diagnosis not present

## 2021-08-01 DIAGNOSIS — F5105 Insomnia due to other mental disorder: Secondary | ICD-10-CM

## 2021-08-01 MED ORDER — ALPRAZOLAM 1 MG PO TABS: 1.0000 mg | ORAL_TABLET | Freq: Two times a day (BID) | ORAL | 3 refills | Status: DC

## 2021-08-01 MED ORDER — PRAZOSIN HCL 2 MG PO CAPS: 4.0000 mg | ORAL_CAPSULE | Freq: Every day | ORAL | 2 refills | Status: DC

## 2021-08-01 MED ORDER — BUPROPION HCL ER (XL) 300 MG PO TB24: 300.0000 mg | ORAL_TABLET | Freq: Every day | ORAL | 2 refills | Status: DC

## 2021-08-01 MED ORDER — BUSPIRONE HCL 15 MG PO TABS: ORAL_TABLET | ORAL | 2 refills | Status: DC

## 2021-08-01 NOTE — Progress Notes (Signed)
[facility]  Patient ID: Jenna Zamora,  MRN: [id number]  PCP: Lujean Amel, MD  Date of Evaluation: 08/01/2021 Time spent:30 minutes  Chief Complaint:  Chief Complaint   Anxiety; Depression; Follow-up; Medication Refill; Trauma     HISTORY/CURRENT STATUS: HPI  51 year old patient presents to this office for follow up and medication management. She continues to feel better overall but she has so much circumstantial family trauma occurring with her children. He daughter is using drugs again and so she has her grandchildren full time. Her son was recently arrested for car theft but it was domestic related. She says that she is just tired from all of these problems. She says she is managing stress better though and only takes xanax when the stress starts to be intolerable. She has a minium of 10 people in her home at all times assisting in providing care. She reports her anxiety today at 5/10 and depression at 2/10. Says she is sleeping better with trazodone and prazosin is helping with the nightmares. No No mania present. No psychosis. No SI/HI.   Past medication failures: Abilify Zoloft     Individual Medical History/ Review of Systems: Changes? :No   Allergies: Morphine and related, Codeine, and Tramadol  Current Medications:  Current Outpatient Medications:    acetaminophen (TYLENOL) 500 MG tablet, Take 500 mg by mouth every 6 (six) hours as needed for mild pain. (Patient not taking: Reported on 01/10/2021), Disp: , Rfl:    albuterol (VENTOLIN HFA) 108 (90 Base) MCG/ACT inhaler, Inhale 1 puff into the lungs every 4 (four) hours., Disp: , Rfl:    ALPRAZolam (XANAX) 1 MG tablet, Take 1 tablet (1 mg total) by mouth 2 (two) times daily., Disp: 60 tablet, Rfl: 3   azithromycin (ZITHROMAX) 250 MG tablet, Take by mouth., Disp: , Rfl:    buPROPion (WELLBUTRIN XL) 300 MG 24 hr tablet, Take 1 tablet (300 mg total) by mouth daily., Disp: 90 tablet, Rfl: 2   busPIRone  (BUSPAR) 15 MG tablet, Take two tablets daily., Disp: 180 tablet, Rfl: 2   cyclobenzaprine (FLEXERIL) 10 MG tablet, Take 1 tablet (10 mg total) by mouth 2 (two) times daily as needed for muscle spasms. (Patient not taking: Reported on 01/10/2021), Disp: 20 tablet, Rfl: 0   doxycycline (VIBRAMYCIN) 100 MG capsule, Take 1 capsule (100 mg total) by mouth 2 (two) times daily. (Patient not taking: Reported on 01/10/2021), Disp: 20 capsule, Rfl: 0   ibuprofen (ADVIL,MOTRIN) 600 MG tablet, Take 1 tablet (600 mg total) by mouth every 6 (six) hours as needed. (Patient taking differently: Take 600 mg by mouth every 6 (six) hours as needed for moderate pain.), Disp: 30 tablet, Rfl: 0   ondansetron (ZOFRAN) 4 MG tablet, Take 1 tablet (4 mg total) by mouth every 6 (six) hours., Disp: 12 tablet, Rfl: 0   potassium chloride SA (K-DUR,KLOR-CON) 20 MEQ tablet, Take 1 tablet (20 mEq total) by mouth 2 (two) times daily., Disp: 10 tablet, Rfl: 0   prazosin (MINIPRESS) 2 MG capsule, Take 2 capsules (4 mg total) by mouth at bedtime., Disp: 180 capsule, Rfl: 2   predniSONE (DELTASONE) 20 MG tablet, 3 po daily for 2 days, then 2 po daily for 2 days, then 1 po daily for 2 days, Disp: , Rfl:    rosuvastatin (CRESTOR) 5 MG tablet, Take 5 mg by mouth daily., Disp: , Rfl:    traZODone (DESYREL) 100 MG tablet, Take 1.5 tablets (150 mg total) by mouth  at bedtime., Disp: 45 tablet, Rfl: 3 Medication Side Effects: none  Family Medical/ Social History: Changes? No  MENTAL HEALTH EXAM:  There were no vitals taken for this visit.There is no height or weight on file to calculate BMI.  General Appearance: Casual  Eye Contact:  Good  Speech:  Clear and Coherent  Volume:  Normal  Mood:  Angry  Affect:  Appropriate  Thought Process:  Coherent  Orientation:  Full (Time, Place, and Person)  Thought Content: Logical   Suicidal Thoughts:  No  Homicidal Thoughts:  No  Memory:  WNL  Judgement:  Good  Insight:  Good  Psychomotor  Activity:  Normal  Concentration:  Concentration: Good  Recall:  Good  Fund of Knowledge: Good  Language: Good  Assets:  Desire for Improvement  ADL's:  Intact  Cognition: WNL  Prognosis:  Good    DIAGNOSES:    ICD-10-CM   1. Generalized anxiety disorder  F41.1 ALPRAZolam (XANAX) 1 MG tablet    buPROPion (WELLBUTRIN XL) 300 MG 24 hr tablet    busPIRone (BUSPAR) 15 MG tablet    2. Major depressive disorder, recurrent episode, moderate (HCC)  F33.1 buPROPion (WELLBUTRIN XL) 300 MG 24 hr tablet    busPIRone (BUSPAR) 15 MG tablet    3. Insomnia due to other mental disorder  F51.05    F99     4. PTSD (post-traumatic stress disorder)  F43.10 prazosin (MINIPRESS) 2 MG capsule      Receiving Psychotherapy: No    RECOMMENDATIONS:  Continue Xanax 1 mg tablet twice daily prn Will continue trazodone 150 mg  Continue prazosin 2 mg capsule at bedtime. Will increase Wellbutrin  to 300 mg XL daily Will report and side effects or worsening symptoms promptly. Will follow up in 3 months to reassess Recommended psychotherapy but patient says that she is not in the position to receive Discussed potential benefits, risk, and side effects of benzodiazepines to include potential risk of tolerance and dependence, as well as possible drowsiness.  Advised patient not to drive if experiencing drowsiness and to take lowest possible effective dose to minimize risk of dependence and tolerance. Greater than 50% of 30 min. of face to face time with patient was spent on counseling and coordination of care. Discussed medication options and for patient to be aware of activating medications such as Wellbutrin when increasing dose. She agreed to report if symptoms of mania present. Agreed to script of xanax to control panic attacks and severe anxiety episodes. She agreed to not take more than prescribed and to slowly back off medication if getting anxiety control with Buspar. Discussed with patient to make sure PCP  reviews labs from 5/29 with her.  Pt agreed to be monitored through regular visits.        Elwanda Brooklyn, NP

## 2021-10-30 ENCOUNTER — Encounter: Payer: Self-pay | Admitting: Behavioral Health

## 2021-10-30 ENCOUNTER — Ambulatory Visit (INDEPENDENT_AMBULATORY_CARE_PROVIDER_SITE_OTHER): Payer: Medicare Other | Admitting: Behavioral Health

## 2021-10-30 DIAGNOSIS — F5105 Insomnia due to other mental disorder: Secondary | ICD-10-CM

## 2021-10-30 DIAGNOSIS — F4310 Post-traumatic stress disorder, unspecified: Secondary | ICD-10-CM | POA: Diagnosis not present

## 2021-10-30 DIAGNOSIS — F331 Major depressive disorder, recurrent, moderate: Secondary | ICD-10-CM

## 2021-10-30 DIAGNOSIS — F411 Generalized anxiety disorder: Secondary | ICD-10-CM | POA: Diagnosis not present

## 2021-10-30 DIAGNOSIS — F99 Mental disorder, not otherwise specified: Secondary | ICD-10-CM

## 2021-10-30 MED ORDER — BUPROPION HCL ER (XL) 300 MG PO TB24: 300.0000 mg | ORAL_TABLET | Freq: Every day | ORAL | 3 refills | Status: DC

## 2021-10-30 MED ORDER — PRAZOSIN HCL 2 MG PO CAPS: 4.0000 mg | ORAL_CAPSULE | Freq: Every day | ORAL | 2 refills | Status: DC

## 2021-10-30 MED ORDER — TRAZODONE HCL 100 MG PO TABS: 150.0000 mg | ORAL_TABLET | Freq: Every day | ORAL | 3 refills | Status: DC

## 2021-10-30 MED ORDER — ALPRAZOLAM 1 MG PO TABS: 1.0000 mg | ORAL_TABLET | Freq: Three times a day (TID) | ORAL | 0 refills | Status: DC | PRN

## 2021-10-30 NOTE — Progress Notes (Signed)
[facility] ? ?Patient ID: Jenna Zamora,  ?MRN: [id number] ? ?PCP: Lujean Amel, MD ? ?Date of Evaluation: 10/30/2021 ?Time spent:30 minutes ? ?Chief Complaint:  ?Chief Complaint   ?Anxiety; Depression; Follow-up; Establish Care; Medication Refill ?  ? ? ?HISTORY/CURRENT STATUS: ?HPI ? ?51 year old patient presents to this office for follow up and medication management. She continues to feel better overall but she has so much circumstantial family trauma occurring with her children continues. Her daughter is using drugs again and so she has her grandchildren full time. She now has all of her grown children back home. Her daughter overdosed again since last visit requiring CPR and AED. Her sons legal case was postponed to May. She says she is managing stress better though and only takes xanax when the stress starts to be intolerable. Lately she has had to use more just to survive. She has a minium of 10 people in her home at all times assisting in providing care. She reports her anxiety today at 7/10 and depression at 4/10. Says she is sleeping better with trazodone and prazosin is helping with the nightmares. No No mania present. No psychosis. No SI/HI. ?  ?Past medication failures: ?Abilify ?Zoloft ? ? ?Individual Medical History/ Review of Systems: Changes? :No  ? ?Allergies: Morphine and related, Codeine, and Tramadol ? ?Current Medications:  ?Current Outpatient Medications:  ?  acetaminophen (TYLENOL) 500 MG tablet, Take 500 mg by mouth every 6 (six) hours as needed for mild pain. (Patient not taking: Reported on 01/10/2021), Disp: , Rfl:  ?  albuterol (VENTOLIN HFA) 108 (90 Base) MCG/ACT inhaler, Inhale 1 puff into the lungs every 4 (four) hours., Disp: , Rfl:  ?  ALPRAZolam (XANAX) 1 MG tablet, Take 1 tablet (1 mg total) by mouth 3 (three) times daily as needed for anxiety., Disp: 90 tablet, Rfl: 0 ?  azithromycin (ZITHROMAX) 250 MG tablet, Take by mouth., Disp: , Rfl:  ?  buPROPion (WELLBUTRIN  XL) 300 MG 24 hr tablet, Take 1 tablet (300 mg total) by mouth daily., Disp: 90 tablet, Rfl: 3 ?  busPIRone (BUSPAR) 15 MG tablet, Take two tablets daily., Disp: 180 tablet, Rfl: 2 ?  cyclobenzaprine (FLEXERIL) 10 MG tablet, Take 1 tablet (10 mg total) by mouth 2 (two) times daily as needed for muscle spasms. (Patient not taking: Reported on 01/10/2021), Disp: 20 tablet, Rfl: 0 ?  doxycycline (VIBRAMYCIN) 100 MG capsule, Take 1 capsule (100 mg total) by mouth 2 (two) times daily. (Patient not taking: Reported on 01/10/2021), Disp: 20 capsule, Rfl: 0 ?  ibuprofen (ADVIL,MOTRIN) 600 MG tablet, Take 1 tablet (600 mg total) by mouth every 6 (six) hours as needed. (Patient taking differently: Take 600 mg by mouth every 6 (six) hours as needed for moderate pain.), Disp: 30 tablet, Rfl: 0 ?  ondansetron (ZOFRAN) 4 MG tablet, Take 1 tablet (4 mg total) by mouth every 6 (six) hours., Disp: 12 tablet, Rfl: 0 ?  potassium chloride SA (K-DUR,KLOR-CON) 20 MEQ tablet, Take 1 tablet (20 mEq total) by mouth 2 (two) times daily., Disp: 10 tablet, Rfl: 0 ?  prazosin (MINIPRESS) 2 MG capsule, Take 2 capsules (4 mg total) by mouth at bedtime., Disp: 180 capsule, Rfl: 2 ?  predniSONE (DELTASONE) 20 MG tablet, 3 po daily for 2 days, then 2 po daily for 2 days, then 1 po daily for 2 days, Disp: , Rfl:  ?  rosuvastatin (CRESTOR) 5 MG tablet, Take 5 mg by mouth daily., Disp: ,  Rfl:  ?  traZODone (DESYREL) 100 MG tablet, Take 1.5 tablets (150 mg total) by mouth at bedtime., Disp: 45 tablet, Rfl: 3 ?Medication Side Effects: none ? ?Family Medical/ Social History: Changes? No ? ?MENTAL HEALTH EXAM: ? ?There were no vitals taken for this visit.There is no height or weight on file to calculate BMI.  ?General Appearance: Casual and Neat  ?Eye Contact:  Good  ?Speech:  Clear and Coherent  ?Volume:  Normal  ?Mood:  Anxious  ?Affect:  Appropriate and Anxious  ?Thought Process:  Coherent  ?Orientation:  Full (Time, Place, and Person)  ?Thought Content:  Logical   ?Suicidal Thoughts:  No  ?Homicidal Thoughts:  No  ?Memory:  WNL  ?Judgement:  Good  ?Insight:  Good  ?Psychomotor Activity:  Normal  ?Concentration:  Concentration: Good  ?Recall:  Good  ?Fund of Knowledge: Good  ?Language: Good  ?Assets:  Desire for Improvement  ?ADL's:  Intact  ?Cognition: WNL  ?Prognosis:  Good  ? ? ?DIAGNOSES:  ?  ICD-10-CM   ?1. Generalized anxiety disorder  F41.1 ALPRAZolam (XANAX) 1 MG tablet  ?  buPROPion (WELLBUTRIN XL) 300 MG 24 hr tablet  ?  traZODone (DESYREL) 100 MG tablet  ?  ?2. Major depressive disorder, recurrent episode, moderate (HCC)  F33.1 buPROPion (WELLBUTRIN XL) 300 MG 24 hr tablet  ?  traZODone (DESYREL) 100 MG tablet  ?  ?3. Insomnia due to other mental disorder  F51.05 traZODone (DESYREL) 100 MG tablet  ? F99   ?  ?4. PTSD (post-traumatic stress disorder)  F43.10 prazosin (MINIPRESS) 2 MG capsule  ?  ? ? ?Receiving Psychotherapy: No  ? ? ?RECOMMENDATIONS:  ? ?Greater than 50% of 30 min. of face to face time with patient was spent on counseling and coordination of care. Discussed her currently stability but she continue to have multiple stressors from her grown children. One of her daughter continues to use heroin and has overdosed several times. She agreed to report if symptoms of mania present. Agreed to script of xanax to control panic attacks and severe anxiety episodes. She agreed to not take more than prescribed and to slowly back off medication if getting anxiety control with Buspar. She locks her Xanax up in home. Pt agreed to be monitored through regular visits.  ? ?Continue Xanax 1 mg tablet twice daily prn. She may take 3 tablet afternoon dose. This is temporary while she is dealing with increased anxiety from taking care of multiple family members. She understands that we will need to decrease again next month.  ?Will continue trazodone 150 mg ? Continue prazosin 2 mg capsule at bedtime. ?Continue Wellbutrin  to 300 mg XL daily ?Will report and side  effects or worsening symptoms promptly. ?Will follow up in 3 months to reassess ?Provided emergency contact information ?Recommended psychotherapy but patient says that she is not in the position to receive ?Discussed potential benefits, risk, and side effects of benzodiazepines to include potential risk of tolerance and dependence, as well as possible drowsiness.  Advised patient not to drive if experiencing drowsiness and to take lowest possible effective dose to minimize risk of dependence and tolerance. ?Reviewed PDMP ? ? ?Elwanda Brooklyn, NP  ?

## 2021-11-29 ENCOUNTER — Telehealth: Payer: Self-pay | Admitting: Behavioral Health

## 2021-11-29 ENCOUNTER — Other Ambulatory Visit: Payer: Self-pay | Admitting: Behavioral Health

## 2021-11-29 DIAGNOSIS — F411 Generalized anxiety disorder: Secondary | ICD-10-CM

## 2021-11-29 MED ORDER — ALPRAZOLAM 1 MG PO TABS: 1.0000 mg | ORAL_TABLET | Freq: Two times a day (BID) | ORAL | 3 refills | Status: DC | PRN

## 2021-11-29 NOTE — Telephone Encounter (Signed)
RX sent

## 2021-11-29 NOTE — Telephone Encounter (Signed)
Pt called at 1 pm asking for a refill on her xanax. She said that brian increased to  3 x dailytohelp her get through family issues. She now wants a new script that goes back to 1 mg 2 x d. [facility] [place] ?

## 2021-12-27 ENCOUNTER — Telehealth: Payer: Self-pay | Admitting: Behavioral Health

## 2021-12-27 NOTE — Telephone Encounter (Signed)
Jenna Zamora notified.

## 2021-12-27 NOTE — Telephone Encounter (Signed)
Patient lvm today at 1:06 . She stated a very serious thing has happened and she need to speak with Aaron Edelman today. # [phone].

## 2021-12-31 NOTE — Telephone Encounter (Signed)
Please advise based on info here and conversation last week.

## 2021-12-31 NOTE — Telephone Encounter (Signed)
Patient called regarding a personal family matter with her grandchildren. States that since this has happened she has been having night terrors even with taking Trazodone and PTSD medication. She would like to know what she should do. Ph: [phone]

## 2022-01-01 ENCOUNTER — Other Ambulatory Visit: Payer: Self-pay | Admitting: Behavioral Health

## 2022-01-01 DIAGNOSIS — F331 Major depressive disorder, recurrent, moderate: Secondary | ICD-10-CM

## 2022-01-01 DIAGNOSIS — F5105 Insomnia due to other mental disorder: Secondary | ICD-10-CM

## 2022-01-01 DIAGNOSIS — F411 Generalized anxiety disorder: Secondary | ICD-10-CM

## 2022-01-01 DIAGNOSIS — F4310 Post-traumatic stress disorder, unspecified: Secondary | ICD-10-CM

## 2022-01-01 MED ORDER — ALPRAZOLAM 1 MG PO TABS: 1.0000 mg | ORAL_TABLET | Freq: Three times a day (TID) | ORAL | 0 refills | Status: DC | PRN

## 2022-01-01 MED ORDER — PRAZOSIN HCL 2 MG PO CAPS: 6.0000 mg | ORAL_CAPSULE | Freq: Every day | ORAL | 3 refills | Status: DC

## 2022-01-01 NOTE — Telephone Encounter (Signed)
Patient notified of Rx and recommendations.  

## 2022-01-01 NOTE — Telephone Encounter (Signed)
I called her on Friday to her mobile number and no answer. Either voice mail was full but no way to leave message.  Need to find out how much Prazosin she is using.  Is she taking (three) 2 mg tablets for 6 mg total? The only thing that will help with night terrors is possibly increasing the Prazosin. Just ask her what she thinks she would like to try to solicit information.

## 2022-01-01 NOTE — Telephone Encounter (Signed)
Tell her to increase Prazosin to three 2 mg tablets (total 6 mg) at bedtime. She is taking to much Melatonin. She should not be taking more than 10 mg. Tell her I will sent new script for xanax 1 mg three times daily just for this month only. Next month we return.

## 2022-01-01 NOTE — Telephone Encounter (Signed)
Patient was prescribed 2 tablets of Prazosin. She would like to increase dose. She is also taking melatonin 5 mg - 3-4 tablets at night just since the most recent family issues.   Patient said you had prescribed alprazolam BID with 1 extra as needed, but that was only for 1 month. She is asking if you could prescribe this again and if not she will make do.

## 2022-01-01 NOTE — Progress Notes (Signed)
p 

## 2022-01-22 ENCOUNTER — Telehealth: Payer: Self-pay | Admitting: Behavioral Health

## 2022-01-22 NOTE — Telephone Encounter (Signed)
Pt called at 10 am stating that brian increased her xanax to 3 times daily. He sent in a script for the new dos on 6/7. However on 6/2 she got her old xanax filled and started taking the 3 a day. The pharmacy will not fill the new script because they are looking at the one she filled on 6/2. She only has two days left. She would like someone to call the pharmacy and see if they can get the pharmacy to fill the 6/7 script

## 2022-01-22 NOTE — Telephone Encounter (Signed)
Spoke to pharmacy and pt,they will allow her to fill it

## 2022-01-23 ENCOUNTER — Other Ambulatory Visit: Payer: Self-pay | Admitting: Behavioral Health

## 2022-01-23 DIAGNOSIS — F5105 Insomnia due to other mental disorder: Secondary | ICD-10-CM

## 2022-01-23 DIAGNOSIS — F331 Major depressive disorder, recurrent, moderate: Secondary | ICD-10-CM

## 2022-01-23 DIAGNOSIS — F411 Generalized anxiety disorder: Secondary | ICD-10-CM

## 2022-02-03 ENCOUNTER — Encounter: Payer: Self-pay | Admitting: Behavioral Health

## 2022-02-03 ENCOUNTER — Ambulatory Visit (INDEPENDENT_AMBULATORY_CARE_PROVIDER_SITE_OTHER): Payer: Medicare Other | Admitting: Behavioral Health

## 2022-02-03 DIAGNOSIS — F4310 Post-traumatic stress disorder, unspecified: Secondary | ICD-10-CM | POA: Diagnosis not present

## 2022-02-03 DIAGNOSIS — F5105 Insomnia due to other mental disorder: Secondary | ICD-10-CM | POA: Diagnosis not present

## 2022-02-03 DIAGNOSIS — F331 Major depressive disorder, recurrent, moderate: Secondary | ICD-10-CM | POA: Diagnosis not present

## 2022-02-03 DIAGNOSIS — F99 Mental disorder, not otherwise specified: Secondary | ICD-10-CM

## 2022-02-03 DIAGNOSIS — F411 Generalized anxiety disorder: Secondary | ICD-10-CM

## 2022-02-03 MED ORDER — ALPRAZOLAM 1 MG PO TABS: 1.0000 mg | ORAL_TABLET | Freq: Three times a day (TID) | ORAL | 0 refills | Status: DC | PRN

## 2022-02-03 MED ORDER — PRAZOSIN HCL 2 MG PO CAPS: 6.0000 mg | ORAL_CAPSULE | Freq: Every day | ORAL | 3 refills | Status: DC

## 2022-02-03 MED ORDER — BUPROPION HCL ER (XL) 300 MG PO TB24: 300.0000 mg | ORAL_TABLET | Freq: Every day | ORAL | 3 refills | Status: DC

## 2022-02-03 MED ORDER — TRAZODONE HCL 100 MG PO TABS: ORAL_TABLET | ORAL | 1 refills | Status: DC

## 2022-02-03 NOTE — Progress Notes (Signed)
[facility]  Patient ID: Jenna Zamora,  MRN: [id number]  PCP: Lujean Amel, MD  Date of Evaluation: 02/03/2022 Time spent:30 minutes  Chief Complaint:  Chief Complaint   Anxiety; Depression; Follow-up; Medication Refill; Medication Problem; Family Problem     HISTORY/CURRENT STATUS: HPI  51 year old patient presents to this office for follow up and medication management. She continues to feel better overall but she has so much circumstantial family trauma occurring with her children continues. Her daughter is using drugs again and so she has her grandchildren full time. She now has all of her grown children back home. CPS was recently involved in custody of her grandchildren who she helps raise. She is dealing with legal case and has attorney.  She says she is managing stress better though and only takes xanax when the stress starts to be intolerable. Lately she has had to use more just to survive. She has a minium of 10 people in her home at all times assisting in providing care. She reports her anxiety today at 7/10 and depression at 4/10. Says she is sleeping better with trazodone and prazosin is helping with the nightmares. No No mania present. No psychosis. No SI/HI.   Past medication failures: Abilify Zoloft   Individual Medical History/ Review of Systems: Changes? :No   Allergies: Morphine and related, Codeine, and Tramadol  Current Medications:  Current Outpatient Medications:    acetaminophen (TYLENOL) 500 MG tablet, Take 500 mg by mouth every 6 (six) hours as needed for mild pain. (Patient not taking: Reported on 01/10/2021), Disp: , Rfl:    albuterol (VENTOLIN HFA) 108 (90 Base) MCG/ACT inhaler, Inhale 1 puff into the lungs every 4 (four) hours., Disp: , Rfl:    ALPRAZolam (XANAX) 1 MG tablet, Take 1 tablet (1 mg total) by mouth 3 (three) times daily as needed for anxiety., Disp: 90 tablet, Rfl: 0   azithromycin (ZITHROMAX) 250 MG tablet, Take by mouth.,  Disp: , Rfl:    buPROPion (WELLBUTRIN XL) 300 MG 24 hr tablet, Take 1 tablet (300 mg total) by mouth daily., Disp: 90 tablet, Rfl: 3   busPIRone (BUSPAR) 15 MG tablet, Take two tablets daily., Disp: 180 tablet, Rfl: 2   cyclobenzaprine (FLEXERIL) 10 MG tablet, Take 1 tablet (10 mg total) by mouth 2 (two) times daily as needed for muscle spasms. (Patient not taking: Reported on 01/10/2021), Disp: 20 tablet, Rfl: 0   doxycycline (VIBRAMYCIN) 100 MG capsule, Take 1 capsule (100 mg total) by mouth 2 (two) times daily. (Patient not taking: Reported on 01/10/2021), Disp: 20 capsule, Rfl: 0   ibuprofen (ADVIL,MOTRIN) 600 MG tablet, Take 1 tablet (600 mg total) by mouth every 6 (six) hours as needed. (Patient taking differently: Take 600 mg by mouth every 6 (six) hours as needed for moderate pain.), Disp: 30 tablet, Rfl: 0   ondansetron (ZOFRAN) 4 MG tablet, Take 1 tablet (4 mg total) by mouth every 6 (six) hours., Disp: 12 tablet, Rfl: 0   potassium chloride SA (K-DUR,KLOR-CON) 20 MEQ tablet, Take 1 tablet (20 mEq total) by mouth 2 (two) times daily., Disp: 10 tablet, Rfl: 0   prazosin (MINIPRESS) 2 MG capsule, Take 3 capsules (6 mg total) by mouth at bedtime., Disp: 90 capsule, Rfl: 3   predniSONE (DELTASONE) 20 MG tablet, 3 po daily for 2 days, then 2 po daily for 2 days, then 1 po daily for 2 days, Disp: , Rfl:    rosuvastatin (CRESTOR) 5 MG tablet, Take  5 mg by mouth daily., Disp: , Rfl:    traZODone (DESYREL) 100 MG tablet, TAKE 1.5 TABLETS BY MOUTH AT BEDTIME., Disp: 135 tablet, Rfl: 0 Medication Side Effects: anxiety  Family Medical/ Social History: Changes? No  MENTAL HEALTH EXAM:  There were no vitals taken for this visit.There is no height or weight on file to calculate BMI.  General Appearance: Casual and Well Groomed  Eye Contact:  Good  Speech:  Clear and Coherent  Volume:  Normal  Mood:  Anxious  Affect:  Appropriate  Thought Process:  Coherent  Orientation:  Full (Time, Place, and  Person)  Thought Content: Logical   Suicidal Thoughts:  No  Homicidal Thoughts:  No  Memory:  WNL  Judgement:  Good  Insight:  Good  Psychomotor Activity:  Normal  Concentration:  Concentration: Good  Recall:  Good  Fund of Knowledge: Good  Language: Good  Assets:  Desire for Improvement  ADL's:  Intact  Cognition: WNL  Prognosis:  Good    DIAGNOSES:    ICD-10-CM   1. Generalized anxiety disorder  F41.1     2. Major depressive disorder, recurrent episode, moderate (HCC)  F33.1     3. Insomnia due to other mental disorder  F51.05    F99     4. PTSD (post-traumatic stress disorder)  F43.10       Receiving Psychotherapy: No    RECOMMENDATIONS:   Greater than 50% of 30 min. of face to face time with patient was spent on counseling and coordination of care. Discussed her currently stability but she continue to have multiple stressors from her grown children. One of her daughter continues to use heroin and has overdosed several times. CPS took her grandchildren from home for one night. She acknowledges the turmoil with her children and drug addiction continues to be a major source of stress. She agreed to not take more than prescribed and to slowly back off medication if getting anxiety control with Buspar. She locks her Xanax up in home. Pt agreed to be monitored through regular visits. She has been consistent with appointments. No request for early fills.    Continue Xanax 1 mg tablet twice daily prn. She may take 3 tablet afternoon dose. This is temporary while she is dealing with increased anxiety from taking care of multiple family members. She understands that we will need to decrease again next month.  Will continue trazodone 150 mg  Continue prazosin 2 mg capsule at bedtime. Continue Wellbutrin  to 300 mg XL daily Will report and side effects or worsening symptoms promptly. Will follow up in 3 months to reassess Provided emergency contact information Recommended  psychotherapy but patient says that she is not in the position to receive Discussed potential benefits, risk, and side effects of benzodiazepines to include potential risk of tolerance and dependence, as well as possible drowsiness.  Advised patient not to drive if experiencing drowsiness and to take lowest possible effective dose to minimize risk of dependence and tolerance. Reviewed PDMP          Elwanda Brooklyn, NP

## 2022-02-04 ENCOUNTER — Other Ambulatory Visit: Payer: Self-pay

## 2022-02-04 DIAGNOSIS — F411 Generalized anxiety disorder: Secondary | ICD-10-CM

## 2022-02-04 MED ORDER — ALPRAZOLAM 1 MG PO TABS: 1.0000 mg | ORAL_TABLET | Freq: Three times a day (TID) | ORAL | 0 refills | Status: DC | PRN

## 2022-02-25 ENCOUNTER — Telehealth: Payer: Self-pay | Admitting: Behavioral Health

## 2022-02-25 NOTE — Telephone Encounter (Deleted)
FYI.  I have left VM with patient. She filled alprazolam on 7/28 so she shouldn't need any. I don't believe she is asking for Prilosec.   [email or web address]

## 2022-02-25 NOTE — Telephone Encounter (Signed)
Yes, I cannot do anymore fills of xanax for sure right now. Hopefully she will return call.

## 2022-02-25 NOTE — Telephone Encounter (Signed)
Next appt is 05/06/22. Jenna Zamora called requesting a prescription for Prilosec. Her daughter [name] was found dead on 03/06/23 and was 7-1/2 months pregnant. Her funeral was this past Friday. [id number] [place] Phone:  [phone] Fax:  [phone]

## 2022-02-25 NOTE — Telephone Encounter (Signed)
LVM to RC 

## 2022-02-25 NOTE — Telephone Encounter (Signed)
I suspect patient is asking for alprazolam not Prilosec. Last filled 02/21/22 #90.

## 2022-02-27 NOTE — Telephone Encounter (Signed)
Called patient again. Patient had a refill available and does not need a new Rx.

## 2022-03-22 ENCOUNTER — Other Ambulatory Visit: Payer: Self-pay | Admitting: Behavioral Health

## 2022-03-22 DIAGNOSIS — F331 Major depressive disorder, recurrent, moderate: Secondary | ICD-10-CM

## 2022-03-22 DIAGNOSIS — F411 Generalized anxiety disorder: Secondary | ICD-10-CM
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
## 2022-04-24 ENCOUNTER — Other Ambulatory Visit: Payer: Self-pay | Admitting: Behavioral Health

## 2022-04-24 DIAGNOSIS — F411 Generalized anxiety disorder: Secondary | ICD-10-CM

## 2022-04-25 ENCOUNTER — Other Ambulatory Visit: Payer: Self-pay

## 2022-04-25 ENCOUNTER — Telehealth: Payer: Self-pay | Admitting: Behavioral Health

## 2022-04-25 DIAGNOSIS — F411 Generalized anxiety disorder: Secondary | ICD-10-CM

## 2022-04-25 MED ORDER — ALPRAZOLAM 1 MG PO TABS: ORAL_TABLET | ORAL | 0 refills | Status: DC

## 2022-04-25 NOTE — Telephone Encounter (Signed)
Pt called about her xanax refill. It looks like it went to print instead of electronically sent. Please fix and resend

## 2022-04-25 NOTE — Telephone Encounter (Signed)
Resent

## 2022-05-06 ENCOUNTER — Ambulatory Visit: Payer: Medicare Other | Admitting: Behavioral Health

## 2022-05-06 NOTE — Progress Notes (Signed)
Patient called and was running late. She had recent death of daughter. Will not assess additional fees at this time due to circumstances.

## 2022-05-13 ENCOUNTER — Ambulatory Visit: Payer: Medicare Other | Admitting: Behavioral Health

## 2022-05-17 DIAGNOSIS — J40 Bronchitis, not specified as acute or chronic: Secondary | ICD-10-CM | POA: Diagnosis not present

## 2022-05-21 ENCOUNTER — Ambulatory Visit: Payer: Medicare Other | Admitting: Behavioral Health

## 2022-05-23 ENCOUNTER — Other Ambulatory Visit: Payer: Self-pay

## 2022-05-23 ENCOUNTER — Other Ambulatory Visit: Payer: Self-pay | Admitting: Psychiatry

## 2022-05-23 ENCOUNTER — Telehealth: Payer: Self-pay | Admitting: Behavioral Health

## 2022-05-23 DIAGNOSIS — F411 Generalized anxiety disorder: Secondary | ICD-10-CM

## 2022-05-23 MED ORDER — ALPRAZOLAM 1 MG PO TABS: ORAL_TABLET | ORAL | 0 refills | Status: DC

## 2022-05-23 NOTE — Telephone Encounter (Signed)
Pt called reporting Alprazolam was sent to [facility]. Requested [facility] [place]. She's at hospital with daughter [facility]. [facility]

## 2022-05-23 NOTE — Telephone Encounter (Signed)
Jenna Zamora called at 1:31pm to request of her Xanax and to [facility] [address].  She is at the hospital with her daughter who is in critical condition with her heart.  Son just tried to commit suicide. She is having a hard time handling all of this. Appt 11/14.

## 2022-05-23 NOTE — Telephone Encounter (Signed)
Canceled at pharmacy in [place]. Repended for the correct pharmacy.

## 2022-06-03 ENCOUNTER — Telehealth: Payer: Self-pay | Admitting: Behavioral Health

## 2022-06-03 NOTE — Telephone Encounter (Signed)
Pt lvm that she has an appt 11/14. Shew needs a 7 day supply of her xanax to get her to her appt

## 2022-06-04 ENCOUNTER — Other Ambulatory Visit: Payer: Self-pay

## 2022-06-04 DIAGNOSIS — F411 Generalized anxiety disorder: Secondary | ICD-10-CM

## 2022-06-04 MED ORDER — ALPRAZOLAM 1 MG PO TABS: ORAL_TABLET | ORAL | 0 refills | Status: DC

## 2022-06-04 NOTE — Telephone Encounter (Signed)
LVM to let office staff know where to send rx

## 2022-06-04 NOTE — Telephone Encounter (Signed)
Pt called at 9:49a.  She wants the [facility]anax [facility] to [facility] [address] in [place].

## 2022-06-05 ENCOUNTER — Other Ambulatory Visit: Payer: Self-pay

## 2022-06-05 DIAGNOSIS — F411 Generalized anxiety disorder: Secondary | ICD-10-CM

## 2022-06-10 ENCOUNTER — Ambulatory Visit: Payer: Medicare Other | Admitting: Behavioral Health

## 2022-06-16 ENCOUNTER — Telehealth: Payer: Self-pay | Admitting: Behavioral Health

## 2022-06-16 NOTE — Telephone Encounter (Signed)
Next visit is 06/25/22. Jenna Zamora is requesting a few Alprazolam to last until her appt 06/25/22. Pharmacy is:  [id number] [place] [place]  Phone: [phone] Fax: [phone]

## 2022-06-17 ENCOUNTER — Other Ambulatory Visit: Payer: Self-pay

## 2022-06-17 DIAGNOSIS — F411 Generalized anxiety disorder: Secondary | ICD-10-CM

## 2022-06-17 MED ORDER — ALPRAZOLAM 1 MG PO TABS: ORAL_TABLET | ORAL | 0 refills | Status: DC

## 2022-06-17 NOTE — Telephone Encounter (Signed)
Pended.

## 2022-06-25 ENCOUNTER — Other Ambulatory Visit: Payer: Self-pay | Admitting: Behavioral Health

## 2022-06-25 ENCOUNTER — Encounter: Payer: Self-pay | Admitting: Behavioral Health

## 2022-06-25 ENCOUNTER — Ambulatory Visit (INDEPENDENT_AMBULATORY_CARE_PROVIDER_SITE_OTHER): Payer: Medicare Other | Admitting: Behavioral Health

## 2022-06-25 DIAGNOSIS — F5105 Insomnia due to other mental disorder: Secondary | ICD-10-CM | POA: Diagnosis not present

## 2022-06-25 DIAGNOSIS — F4321 Adjustment disorder with depressed mood: Secondary | ICD-10-CM

## 2022-06-25 DIAGNOSIS — F411 Generalized anxiety disorder: Secondary | ICD-10-CM

## 2022-06-25 DIAGNOSIS — F331 Major depressive disorder, recurrent, moderate: Secondary | ICD-10-CM

## 2022-06-25 DIAGNOSIS — F99 Mental disorder, not otherwise specified: Secondary | ICD-10-CM

## 2022-06-25 DIAGNOSIS — Z634 Disappearance and death of family member: Secondary | ICD-10-CM

## 2022-06-25 DIAGNOSIS — F4310 Post-traumatic stress disorder, unspecified: Secondary | ICD-10-CM

## 2022-06-25 MED ORDER — TRAZODONE HCL 100 MG PO TABS: ORAL_TABLET | ORAL | 1 refills | Status: DC

## 2022-06-25 MED ORDER — ALPRAZOLAM 1 MG PO TABS: ORAL_TABLET | ORAL | 3 refills | Status: DC

## 2022-06-25 MED ORDER — DESVENLAFAXINE SUCCINATE ER 50 MG PO TB24: ORAL_TABLET | ORAL | 1 refills | Status: DC

## 2022-06-25 MED ORDER — PRAZOSIN HCL 2 MG PO CAPS: 6.0000 mg | ORAL_CAPSULE | Freq: Every day | ORAL | 3 refills | Status: DC

## 2022-06-25 MED ORDER — DESVENLAFAXINE SUCCINATE ER 100 MG PO TB24: ORAL_TABLET | ORAL | 3 refills | Status: DC

## 2022-06-25 MED ORDER — BUSPIRONE HCL 30 MG PO TABS: 30.0000 mg | ORAL_TABLET | Freq: Two times a day (BID) | ORAL | 3 refills | Status: DC

## 2022-06-25 NOTE — Telephone Encounter (Signed)
Please see pharmacy note.  

## 2022-06-25 NOTE — Progress Notes (Signed)
[facility]  Patient ID: Jenna Zamora,  MRN: [id number]  PCP: Lujean Amel, MD  Date of Evaluation: 06/25/2022 Time spent:30 minutes  Chief Complaint:   HISTORY/CURRENT STATUS: HPI 51 year old patient presents to this office for follow up and medication management. She continues to feel better overall but she has so much circumstantial family trauma occurring with her children continues.  She is extremely tearful and sobbing this visit. Her daughter was found dead on 02-18-2023 th. She was 9 months pregnant. Investigation is pending. Many addiction problems in family. She is requesting new medicine or medication adjustment to help. She reports her anxiety today at 8/10 and depression at  8/10.  She is sleeping 6-7 hours per night with aid of medication.  No No mania present. No psychosis. No SI/HI.   Past medication failures: Abilify Zoloft    Individual Medical History/ Review of Systems: Changes? :No   Allergies: Morphine and related, Codeine, and Tramadol  Current Medications:  Current Outpatient Medications:    acetaminophen (TYLENOL) 500 MG tablet, Take 500 mg by mouth every 6 (six) hours as needed for mild pain. (Patient not taking: Reported on 01/10/2021), Disp: , Rfl:    albuterol (VENTOLIN HFA) 108 (90 Base) MCG/ACT inhaler, Inhale 1 puff into the lungs every 4 (four) hours., Disp: , Rfl:    ALPRAZolam (XANAX) 1 MG tablet, 1/2 to 1 TABLET BY MOUTH THREE TIMES A DAY AS NEEDED FOR ANXIETY, Disp: 18 tablet, Rfl: 0   azithromycin (ZITHROMAX) 250 MG tablet, Take by mouth., Disp: , Rfl:    buPROPion (WELLBUTRIN XL) 300 MG 24 hr tablet, Take 1 tablet (300 mg total) by mouth daily., Disp: 90 tablet, Rfl: 3   busPIRone (BUSPAR) 15 MG tablet, TAKE 2 TABLETS BY MOUTH EVERY DAY, Disp: 180 tablet, Rfl: 2   cyclobenzaprine (FLEXERIL) 10 MG tablet, Take 1 tablet (10 mg total) by mouth 2 (two) times daily as needed for muscle spasms. (Patient not taking: Reported on  01/10/2021), Disp: 20 tablet, Rfl: 0   doxycycline (VIBRAMYCIN) 100 MG capsule, Take 1 capsule (100 mg total) by mouth 2 (two) times daily. (Patient not taking: Reported on 01/10/2021), Disp: 20 capsule, Rfl: 0   ibuprofen (ADVIL,MOTRIN) 600 MG tablet, Take 1 tablet (600 mg total) by mouth every 6 (six) hours as needed. (Patient taking differently: Take 600 mg by mouth every 6 (six) hours as needed for moderate pain.), Disp: 30 tablet, Rfl: 0   ondansetron (ZOFRAN) 4 MG tablet, Take 1 tablet (4 mg total) by mouth every 6 (six) hours., Disp: 12 tablet, Rfl: 0   potassium chloride SA (K-DUR,KLOR-CON) 20 MEQ tablet, Take 1 tablet (20 mEq total) by mouth 2 (two) times daily., Disp: 10 tablet, Rfl: 0   prazosin (MINIPRESS) 2 MG capsule, Take 3 capsules (6 mg total) by mouth at bedtime., Disp: 90 capsule, Rfl: 3   predniSONE (DELTASONE) 20 MG tablet, 3 po daily for 2 days, then 2 po daily for 2 days, then 1 po daily for 2 days, Disp: , Rfl:    rosuvastatin (CRESTOR) 5 MG tablet, Take 5 mg by mouth daily., Disp: , Rfl:    traZODone (DESYREL) 100 MG tablet, TAKE 1.5 TABLETS BY MOUTH AT BEDTIME., Disp: 135 tablet, Rfl: 1 Medication Side Effects: none  Family Medical/ Social History: Changes? No  MENTAL HEALTH EXAM:  There were no vitals taken for this visit.There is no height or weight on file to calculate BMI.  General Appearance: Casual, Neat,  and Well Groomed  Eye Contact:  Good  Speech:  Clear and Coherent  Volume:  Normal  Mood:  Anxious, Depressed, and Dysphoric  Affect:  Congruent, Depressed, Flat, and Tearful  Thought Process:  Coherent  Orientation:  Full (Time, Place, and Person)  Thought Content: Logical   Suicidal Thoughts:  No  Homicidal Thoughts:  No  Memory:  WNL  Judgement:  Good  Insight:  Good  Psychomotor Activity:  Normal  Concentration:  Concentration: Good  Recall:  Good  Fund of Knowledge: Good  Language: Good  Assets:  Desire for Improvement  ADL's:  Intact   Cognition: WNL  Prognosis:  Good    DIAGNOSES: No diagnosis found.  Receiving Psychotherapy: No    RECOMMENDATIONS:    Greater than 50% of 30 min. of face to face time with patient was spent on counseling and coordination of care. We discussed her loss of her daughter 77. She is still in severe grief. Investigation still underway.  We discussed intiatiting new medication to help other than increasing xanax again and she agree. She has been consistent with appointments.    Continue Xanax 0.5-1 mg tablet three times daily prn.  This is temporary while she is dealing with increased anxiety from death of her daughter.  She understands that we will need to decrease again after we get her a little more stable with additional non benzo's.  Will continue trazodone 150 mg  Continue prazosin 2 mg capsule at bedtime. Continue Wellbutrin  to 300 mg XL daily Will report and side effects or worsening symptoms promptly. Will follow up in 4 weeks to reassess Provided emergency contact information Recommended psychotherapy but patient says that she is not in the position to receive Discussed potential benefits, risk, and side effects of benzodiazepines to include potential risk of tolerance and dependence, as well as possible drowsiness.  Advised patient not to drive if experiencing drowsiness and to take lowest possible effective dose to minimize risk of dependence and tolerance. Reviewed PDMP    Elwanda Brooklyn, NP

## 2022-07-04 DIAGNOSIS — J209 Acute bronchitis, unspecified: Secondary | ICD-10-CM | POA: Diagnosis not present

## 2022-07-17 ENCOUNTER — Other Ambulatory Visit: Payer: Self-pay | Admitting: Behavioral Health

## 2022-07-17 DIAGNOSIS — F411 Generalized anxiety disorder: Secondary | ICD-10-CM

## 2022-07-17 DIAGNOSIS — F4310 Post-traumatic stress disorder, unspecified: Secondary | ICD-10-CM

## 2022-07-17 DIAGNOSIS — F331 Major depressive disorder, recurrent, moderate: Secondary | ICD-10-CM

## 2022-08-01 ENCOUNTER — Ambulatory Visit: Payer: Medicare Other | Admitting: Behavioral Health

## 2022-08-12 ENCOUNTER — Ambulatory Visit: Payer: Medicare Other | Admitting: Behavioral Health

## 2022-08-19 ENCOUNTER — Ambulatory Visit: Payer: Medicare Other | Admitting: Behavioral Health

## 2022-09-09 ENCOUNTER — Ambulatory Visit (INDEPENDENT_AMBULATORY_CARE_PROVIDER_SITE_OTHER): Payer: Medicare HMO | Admitting: Behavioral Health

## 2022-09-09 ENCOUNTER — Encounter: Payer: Self-pay | Admitting: Behavioral Health

## 2022-09-09 DIAGNOSIS — F331 Major depressive disorder, recurrent, moderate: Secondary | ICD-10-CM | POA: Diagnosis not present

## 2022-09-09 DIAGNOSIS — F4310 Post-traumatic stress disorder, unspecified: Secondary | ICD-10-CM | POA: Diagnosis not present

## 2022-09-09 DIAGNOSIS — F5105 Insomnia due to other mental disorder: Secondary | ICD-10-CM

## 2022-09-09 DIAGNOSIS — F411 Generalized anxiety disorder: Secondary | ICD-10-CM | POA: Diagnosis not present

## 2022-09-09 DIAGNOSIS — E7800 Pure hypercholesterolemia, unspecified: Secondary | ICD-10-CM | POA: Diagnosis not present

## 2022-09-09 DIAGNOSIS — F99 Mental disorder, not otherwise specified: Secondary | ICD-10-CM

## 2022-09-09 MED ORDER — PRAZOSIN HCL 2 MG PO CAPS: 8.0000 mg | ORAL_CAPSULE | Freq: Every day | ORAL | 3 refills | Status: DC

## 2022-09-09 MED ORDER — BUSPIRONE HCL 30 MG PO TABS: 30.0000 mg | ORAL_TABLET | Freq: Two times a day (BID) | ORAL | 3 refills | Status: DC

## 2022-09-09 MED ORDER — MIRTAZAPINE 15 MG PO TABS: 15.0000 mg | ORAL_TABLET | Freq: Every day | ORAL | 1 refills | Status: DC

## 2022-09-09 MED ORDER — BUPROPION HCL ER (XL) 300 MG PO TB24: 300.0000 mg | ORAL_TABLET | Freq: Every day | ORAL | 3 refills | Status: DC

## 2022-09-09 MED ORDER — ALPRAZOLAM 1 MG PO TABS: ORAL_TABLET | ORAL | 3 refills | Status: DC

## 2022-09-09 MED ORDER — ROSUVASTATIN CALCIUM 20 MG PO TABS: 20.0000 mg | ORAL_TABLET | Freq: Every day | ORAL | 1 refills | Status: DC

## 2022-09-09 MED ORDER — DESVENLAFAXINE SUCCINATE ER 100 MG PO TB24: ORAL_TABLET | ORAL | 3 refills | Status: DC

## 2022-09-09 NOTE — Progress Notes (Signed)
[facility]  Patient ID: Jenna Zamora,  MRN: [id number]  PCP: Lujean Amel, MD  Date of Evaluation: 09/09/2022 Time spent:30 minutes  Chief Complaint:  Chief Complaint   Medication Refill; Anxiety; Depression; Patient Education; Follow-up; Family Problem; Grief     HISTORY/CURRENT STATUS: HPI 52 year old patient presents to this office for follow up and medication management. She continues to feel better overall but she has so much circumstantial family trauma occurring with her children continues. She has another daughter who is back using illicit substances. She is afraid that she will lose her too.  Says her AD is helping her process. She is requesting dosage increase of her Prazosin to help with nightmares. She reports her anxiety today at 5/10 and depression at  4/10.  She is sleeping 6-7 hours per night with aid of medication.  No No mania present. No psychosis. No SI/HI.   Past medication failures: Abilify Zoloft Individual Medical History/ Review of Systems: Changes? :No   Allergies: Morphine and related, Codeine, and Tramadol  Current Medications:  Current Outpatient Medications:    mirtazapine (REMERON) 15 MG tablet, Take 1 tablet (15 mg total) by mouth at bedtime., Disp: 30 tablet, Rfl: 1   rosuvastatin (CRESTOR) 20 MG tablet, Take 1 tablet (20 mg total) by mouth daily., Disp: 90 tablet, Rfl: 1   acetaminophen (TYLENOL) 500 MG tablet, Take 500 mg by mouth every 6 (six) hours as needed for mild pain. (Patient not taking: Reported on 01/10/2021), Disp: , Rfl:    albuterol (VENTOLIN HFA) 108 (90 Base) MCG/ACT inhaler, Inhale 1 puff into the lungs every 4 (four) hours., Disp: , Rfl:    ALPRAZolam (XANAX) 1 MG tablet, 1/2 to 1 TABLET BY MOUTH THREE TIMES A DAY AS NEEDED FOR ANXIETY, Disp: 18 tablet, Rfl: 0   [START ON 09/21/2022] ALPRAZolam (XANAX) 1 MG tablet, Take 1/2 to 1 tablet three times daily for severe anxiety, Disp: 90 tablet, Rfl: 3   azithromycin  (ZITHROMAX) 250 MG tablet, Take by mouth., Disp: , Rfl:    buPROPion (WELLBUTRIN XL) 300 MG 24 hr tablet, Take 1 tablet (300 mg total) by mouth daily., Disp: 90 tablet, Rfl: 3   busPIRone (BUSPAR) 15 MG tablet, TAKE 2 TABLETS BY MOUTH EVERY DAY, Disp: 180 tablet, Rfl: 2   busPIRone (BUSPAR) 30 MG tablet, Take 1 tablet (30 mg total) by mouth 2 (two) times daily., Disp: 60 tablet, Rfl: 3   cyclobenzaprine (FLEXERIL) 10 MG tablet, Take 1 tablet (10 mg total) by mouth 2 (two) times daily as needed for muscle spasms. (Patient not taking: Reported on 01/10/2021), Disp: 20 tablet, Rfl: 0   desvenlafaxine (PRISTIQ) 100 MG 24 hr tablet, Take one whole tablet by mouth daily, Disp: 30 tablet, Rfl: 3   doxycycline (VIBRAMYCIN) 100 MG capsule, Take 1 capsule (100 mg total) by mouth 2 (two) times daily. (Patient not taking: Reported on 01/10/2021), Disp: 20 capsule, Rfl: 0   ibuprofen (ADVIL,MOTRIN) 600 MG tablet, Take 1 tablet (600 mg total) by mouth every 6 (six) hours as needed. (Patient taking differently: Take 600 mg by mouth every 6 (six) hours as needed for moderate pain.), Disp: 30 tablet, Rfl: 0   ondansetron (ZOFRAN) 4 MG tablet, Take 1 tablet (4 mg total) by mouth every 6 (six) hours., Disp: 12 tablet, Rfl: 0   potassium chloride SA (K-DUR,KLOR-CON) 20 MEQ tablet, Take 1 tablet (20 mEq total) by mouth 2 (two) times daily., Disp: 10 tablet, Rfl: 0  prazosin (MINIPRESS) 2 MG capsule, Take 4 capsules (8 mg total) by mouth at bedtime., Disp: 120 capsule, Rfl: 3   predniSONE (DELTASONE) 20 MG tablet, 3 po daily for 2 days, then 2 po daily for 2 days, then 1 po daily for 2 days, Disp: , Rfl:    rosuvastatin (CRESTOR) 5 MG tablet, Take 5 mg by mouth daily., Disp: , Rfl:    traZODone (DESYREL) 100 MG tablet, TAKE 1.5 TABLETS BY MOUTH AT BEDTIME., Disp: 135 tablet, Rfl: 1 Medication Side Effects: none  Family Medical/ Social History: Changes? No  MENTAL HEALTH EXAM:  There were no vitals taken for this  visit.There is no height or weight on file to calculate BMI.  General Appearance: Casual and Well Groomed  Eye Contact:  Good  Speech:  Clear and Coherent  Volume:  Normal  Mood:  Anxious, Depressed, and Dysphoric  Affect:  Appropriate, Congruent, Depressed, Flat, and Tearful  Thought Process:  Coherent  Orientation:  Full (Time, Place, and Person)  Thought Content: Logical   Suicidal Thoughts:  No  Homicidal Thoughts:  No  Memory:  WNL  Judgement:  Good  Insight:  Good  Psychomotor Activity:  Normal  Concentration:  Concentration: Good  Recall:  Good  Fund of Knowledge: Good  Language: Good  Assets:  Desire for Improvement  ADL's:  Intact  Cognition: WNL  Prognosis:  Good    DIAGNOSES:    ICD-10-CM   1. Hypercholesteremia  E78.00 rosuvastatin (CRESTOR) 20 MG tablet    2. Major depressive disorder, recurrent episode, moderate (HCC)  F33.1 buPROPion (WELLBUTRIN XL) 300 MG 24 hr tablet    busPIRone (BUSPAR) 30 MG tablet    desvenlafaxine (PRISTIQ) 100 MG 24 hr tablet    3. Generalized anxiety disorder  F41.1 buPROPion (WELLBUTRIN XL) 300 MG 24 hr tablet    busPIRone (BUSPAR) 30 MG tablet    desvenlafaxine (PRISTIQ) 100 MG 24 hr tablet    ALPRAZolam (XANAX) 1 MG tablet    4. 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 of face to face time with patient was spent on counseling and coordination of care. We discussed her progress with grief. We talked about her current stability and medication regimen.  She has been consistent with appointments.    Continue Xanax 0.5-1 mg tablet three times daily prn.  This is temporary while she is dealing with increased anxiety from death of her daughter.  She understands that  we will need to decrease again after we get her a little more stable with additional non benzo's.  Stopped Trazodone To start Mirtazapine 15 mg at bedtime for sleep To increase prazosin to 8 mg at bedtime. Continue Wellbutrin  to 300 mg XL daily Will report and side effects or worsening symptoms promptly. Will follow up in 3 months to reassess Provided emergency contact information Recommended psychotherapy but patient says that she is not in the position to receive Discussed potential benefits, risk, and side effects of benzodiazepines to include potential risk of tolerance and dependence, as well as possible drowsiness.  Advised patient not to drive if experiencing drowsiness and to take lowest possible effective dose to minimize risk of dependence  and tolerance. Reviewed PDMP    Elwanda Brooklyn, NP

## 2022-10-02 ENCOUNTER — Other Ambulatory Visit: Payer: Self-pay | Admitting: Behavioral Health

## 2022-10-02 DIAGNOSIS — F5105 Insomnia due to other mental disorder: Secondary | ICD-10-CM

## 2022-10-17 ENCOUNTER — Telehealth: Payer: Self-pay | Admitting: Behavioral Health

## 2022-10-17 NOTE — Telephone Encounter (Signed)
Pt called at 12:19p.  She said she feels the Mirtazipine and Desvenlafaxine are not helping.  Pls call her back to discuss.  Next appt 5/13

## 2022-10-20 NOTE — Telephone Encounter (Signed)
Patient said she stopped Pristiq about a week ago. She said she wasn't leaving the house and was crying all the time. She stopped the mirtazapine and went back to trazodone because it wasn't helping her with sleep. She was not requesting any other medication.

## 2022-10-26 ENCOUNTER — Other Ambulatory Visit: Payer: Self-pay | Admitting: Behavioral Health

## 2022-10-26 DIAGNOSIS — F4310 Post-traumatic stress disorder, unspecified: Secondary | ICD-10-CM

## 2022-11-17 ENCOUNTER — Other Ambulatory Visit: Payer: Self-pay | Admitting: Behavioral Health

## 2022-11-17 DIAGNOSIS — F331 Major depressive disorder, recurrent, moderate: Secondary | ICD-10-CM

## 2022-11-17 DIAGNOSIS — F5105 Insomnia due to other mental disorder: Secondary | ICD-10-CM

## 2022-11-17 DIAGNOSIS — F411 Generalized anxiety disorder: Secondary | ICD-10-CM

## 2022-11-19 NOTE — Telephone Encounter (Signed)
Last note says she stopped it. Need to verify.

## 2022-11-20 NOTE — Telephone Encounter (Signed)
LVM to RC to see if patient was taking or not.

## 2022-12-08 ENCOUNTER — Ambulatory Visit: Payer: Medicare HMO | Admitting: Behavioral Health

## 2022-12-10 ENCOUNTER — Ambulatory Visit: Payer: Medicare HMO | Admitting: Behavioral Health

## 2022-12-15 ENCOUNTER — Other Ambulatory Visit: Payer: Self-pay | Admitting: Behavioral Health

## 2022-12-15 DIAGNOSIS — F5105 Insomnia due to other mental disorder: Secondary | ICD-10-CM

## 2022-12-15 DIAGNOSIS — F331 Major depressive disorder, recurrent, moderate: Secondary | ICD-10-CM

## 2022-12-15 DIAGNOSIS — F411 Generalized anxiety disorder: Secondary | ICD-10-CM

## 2022-12-15 NOTE — Telephone Encounter (Signed)
Is she taking? Switched to mirtazapine but then noted to switch back to trazodone because reported mirtazapine not working.

## 2022-12-19 ENCOUNTER — Ambulatory Visit: Payer: Medicare HMO | Admitting: Behavioral Health

## 2022-12-21 ENCOUNTER — Other Ambulatory Visit: Payer: Self-pay | Admitting: Behavioral Health

## 2022-12-21 DIAGNOSIS — F4310 Post-traumatic stress disorder, unspecified: Secondary | ICD-10-CM

## 2023-01-14 ENCOUNTER — Other Ambulatory Visit: Payer: Self-pay | Admitting: Behavioral Health

## 2023-01-14 DIAGNOSIS — F4310 Post-traumatic stress disorder, unspecified: Secondary | ICD-10-CM

## 2023-01-14 DIAGNOSIS — E7800 Pure hypercholesterolemia, unspecified: Secondary | ICD-10-CM

## 2023-01-26 ENCOUNTER — Ambulatory Visit (INDEPENDENT_AMBULATORY_CARE_PROVIDER_SITE_OTHER): Payer: Self-pay | Admitting: Behavioral Health

## 2023-01-26 DIAGNOSIS — Z0389 Encounter for observation for other suspected diseases and conditions ruled out: Secondary | ICD-10-CM

## 2023-01-26 NOTE — Progress Notes (Signed)
Pt did not show for scheduled appt and did not provide 24 hour notice as required. Additional fees to be assessed.  

## 2023-02-05 ENCOUNTER — Other Ambulatory Visit: Payer: Self-pay | Admitting: Behavioral Health

## 2023-02-05 DIAGNOSIS — F4310 Post-traumatic stress disorder, unspecified: Secondary | ICD-10-CM

## 2023-02-09 ENCOUNTER — Other Ambulatory Visit: Payer: Self-pay | Admitting: Behavioral Health

## 2023-02-09 DIAGNOSIS — F4310 Post-traumatic stress disorder, unspecified: Secondary | ICD-10-CM

## 2023-02-10 ENCOUNTER — Ambulatory Visit: Payer: Medicare HMO | Admitting: Behavioral Health

## 2023-02-10 NOTE — Telephone Encounter (Signed)
Has appt 7/18 

## 2023-02-11 ENCOUNTER — Other Ambulatory Visit: Payer: Self-pay | Admitting: Behavioral Health

## 2023-02-11 ENCOUNTER — Telehealth: Payer: Self-pay | Admitting: Behavioral Health

## 2023-02-11 DIAGNOSIS — F331 Major depressive disorder, recurrent, moderate: Secondary | ICD-10-CM

## 2023-02-11 DIAGNOSIS — F411 Generalized anxiety disorder: Secondary | ICD-10-CM

## 2023-02-11 MED ORDER — ALPRAZOLAM 1 MG PO TABS
ORAL_TABLET | ORAL | 0 refills | Status: DC
Start: 2023-02-11 — End: 2023-03-02

## 2023-02-11 NOTE — Telephone Encounter (Signed)
Received RF request from pharmacy and addressed there.

## 2023-02-11 NOTE — Telephone Encounter (Signed)
Jenna Zamora called at 11:25 to request refill of her Xanax.  Appt 7/23.  Send to  [id number] - [place], [place] - [address] AT [place] [address]

## 2023-02-12 ENCOUNTER — Ambulatory Visit: Payer: Medicare HMO | Admitting: Behavioral Health

## 2023-02-17 ENCOUNTER — Ambulatory Visit (INDEPENDENT_AMBULATORY_CARE_PROVIDER_SITE_OTHER): Payer: Self-pay | Admitting: Behavioral Health

## 2023-02-17 DIAGNOSIS — Z0389 Encounter for observation for other suspected diseases and conditions ruled out: Secondary | ICD-10-CM

## 2023-02-17 NOTE — Progress Notes (Signed)
Pt did not show for scheduled appt and did not provide 24 hour notice as required and additional fees to be assessed.

## 2023-03-02 ENCOUNTER — Encounter: Payer: Self-pay | Admitting: Behavioral Health

## 2023-03-02 ENCOUNTER — Telehealth: Payer: Medicare HMO | Admitting: Behavioral Health

## 2023-03-02 DIAGNOSIS — F4310 Post-traumatic stress disorder, unspecified: Secondary | ICD-10-CM | POA: Diagnosis not present

## 2023-03-02 DIAGNOSIS — F411 Generalized anxiety disorder: Secondary | ICD-10-CM

## 2023-03-02 DIAGNOSIS — F331 Major depressive disorder, recurrent, moderate: Secondary | ICD-10-CM

## 2023-03-02 MED ORDER — BUSPIRONE HCL 30 MG PO TABS
30.0000 mg | ORAL_TABLET | Freq: Two times a day (BID) | ORAL | 3 refills | Status: DC
Start: 2023-03-02 — End: 2023-06-12

## 2023-03-02 MED ORDER — PRAZOSIN HCL 2 MG PO CAPS
8.0000 mg | ORAL_CAPSULE | Freq: Every day | ORAL | 2 refills | Status: DC
Start: 2023-03-02 — End: 2023-06-05

## 2023-03-02 MED ORDER — ALPRAZOLAM 1 MG PO TABS
ORAL_TABLET | ORAL | 2 refills | Status: DC
Start: 2023-03-02 — End: 2023-06-11

## 2023-03-02 MED ORDER — BUPROPION HCL ER (XL) 300 MG PO TB24
300.0000 mg | ORAL_TABLET | Freq: Every day | ORAL | 3 refills | Status: DC
Start: 2023-03-02 — End: 2023-06-12

## 2023-03-02 NOTE — Progress Notes (Deleted)
[facility]  Patient ID: Jenna Zamora,  MRN: [id number]  PCP: Darrow Bussing, MD  Date of Evaluation: 03/02/2023 Time spent:30 minutes  Chief Complaint:   HISTORY/CURRENT STATUS: HPI  Individual Medical History/ Review of Systems: Changes? :No   Allergies: Morphine and codeine, Codeine, and Tramadol  Current Medications:  Current Outpatient Medications:    acetaminophen (TYLENOL) 500 MG tablet, Take 500 mg by mouth every 6 (six) hours as needed for mild pain. (Patient not taking: Reported on 01/10/2021), Disp: , Rfl:    albuterol (VENTOLIN HFA) 108 (90 Base) MCG/ACT inhaler, Inhale 1 puff into the lungs every 4 (four) hours., Disp: , Rfl:    ALPRAZolam (XANAX) 1 MG tablet, 1/2 to 1 TABLET BY MOUTH THREE TIMES A DAY AS NEEDED FOR ANXIETY, Disp: 90 tablet, Rfl: 0   azithromycin (ZITHROMAX) 250 MG tablet, Take by mouth., Disp: , Rfl:    buPROPion (WELLBUTRIN XL) 300 MG 24 hr tablet, Take 1 tablet (300 mg total) by mouth daily., Disp: 90 tablet, Rfl: 3   busPIRone (BUSPAR) 15 MG tablet, TAKE 2 TABLETS BY MOUTH EVERY DAY, Disp: 180 tablet, Rfl: 2   busPIRone (BUSPAR) 30 MG tablet, Take 1 tablet (30 mg total) by mouth 2 (two) times daily., Disp: 60 tablet, Rfl: 3   cyclobenzaprine (FLEXERIL) 10 MG tablet, Take 1 tablet (10 mg total) by mouth 2 (two) times daily as needed for muscle spasms. (Patient not taking: Reported on 01/10/2021), Disp: 20 tablet, Rfl: 0   desvenlafaxine (PRISTIQ) 100 MG 24 hr tablet, Take one whole tablet by mouth daily, Disp: 30 tablet, Rfl: 3   doxycycline (VIBRAMYCIN) 100 MG capsule, Take 1 capsule (100 mg total) by mouth 2 (two) times daily. (Patient not taking: Reported on 01/10/2021), Disp: 20 capsule, Rfl: 0   ibuprofen (ADVIL,MOTRIN) 600 MG tablet, Take 1 tablet (600 mg total) by mouth every 6 (six) hours as needed. (Patient taking differently: Take 600 mg by mouth every 6 (six) hours as needed for moderate pain.), Disp: 30 tablet, Rfl: 0    mirtazapine (REMERON) 15 MG tablet, TAKE 1 TABLET BY MOUTH EVERYDAY AT BEDTIME, Disp: 90 tablet, Rfl: 0   ondansetron (ZOFRAN) 4 MG tablet, Take 1 tablet (4 mg total) by mouth every 6 (six) hours., Disp: 12 tablet, Rfl: 0   potassium chloride SA (K-DUR,KLOR-CON) 20 MEQ tablet, Take 1 tablet (20 mEq total) by mouth 2 (two) times daily., Disp: 10 tablet, Rfl: 0   prazosin (MINIPRESS) 2 MG capsule, TAKE 4 CAPSULES (8 MG TOTAL) BY MOUTH AT BEDTIME., Disp: 120 capsule, Rfl: 0   predniSONE (DELTASONE) 20 MG tablet, 3 po daily for 2 days, then 2 po daily for 2 days, then 1 po daily for 2 days, Disp: , Rfl:    rosuvastatin (CRESTOR) 20 MG tablet, TAKE 1 TABLET BY MOUTH EVERY DAY, Disp: 90 tablet, Rfl: 1   rosuvastatin (CRESTOR) 5 MG tablet, Take 5 mg by mouth daily., Disp: , Rfl:    traZODone (DESYREL) 100 MG tablet, TAKE 1 AND 1/2 TABLETS BY MOUTH AT BEDTIME, Disp: 135 tablet, Rfl: 1 Medication Side Effects: {Medication Side Effects (Optional):12147}  Family Medical/ Social History: Changes? No  MENTAL HEALTH EXAM:  There were no vitals taken for this visit.There is no height or weight on file to calculate BMI.  General Appearance: Casual, Neat, and Well Groomed  Eye Contact:  Good  Speech:  Clear and Coherent  Volume:  Normal  Mood:  Anxious, Depressed, and Dysphoric  Affect:  Appropriate  Thought Process:  Coherent  Orientation:  Full (Time, Place, and Person)  Thought Content: Logical   Suicidal Thoughts:  No  Homicidal Thoughts:  No  Memory:  WNL  Judgement:  Good  Insight:  Good  Psychomotor Activity:  Normal  Concentration:  Concentration: Good  Recall:  Good  Fund of Knowledge: Good  Language: Good  Assets:  Desire for Improvement  ADL's:  Intact  Cognition: WNL  Prognosis:  Good    DIAGNOSES: No diagnosis found.  Receiving Psychotherapy: No    RECOMMENDATIONS:  Greater than 50% of 30 min. of face to face time with patient was spent on counseling and coordination of  care. We discussed her progress with grief. Still has one daughter on heroin and fentanyl. Worries about this daughter OD. Living in a constant state of stress. We talked about her current stability and medication regimen.  She has been consistent with appointments.    Continue Xanax 0.5-1 mg tablet three times daily prn.  This is temporary while she is dealing with increased anxiety from death of her daughter.  Stopped Pristiq She understands that we will need to decrease again after we get her a little more stable with additional non benzo's.  Stopped Trazodone Stopped Mirtazapine To continue  prazosin to 8 mg at bedtime. Continue Wellbutrin  to 300 mg XL daily Will report and side effects or worsening symptoms promptly. Will follow up in 3 months to reassess Provided emergency contact information Recommended psychotherapy but patient says that she is not in the position to receive Discussed potential benefits, risk, and side effects of benzodiazepines to include potential risk of tolerance and dependence, as well as possible drowsiness.  Advised patient not to drive if experiencing drowsiness and to take lowest possible effective dose to minimize risk of dependence and tolerance. Reviewed PDMP      Joan Flores, NP

## 2023-03-02 NOTE — Progress Notes (Signed)
Jenna Zamora [id number] 09-02-1970 52 y.o.  Virtual Visit via Video Note  I connected with pt @ on 03/02/23 at 11:30 AM EDT by a video enabled telemedicine application and verified that I am speaking with the correct person using two identifiers.   I discussed the limitations of evaluation and management by telemedicine and the availability of in person appointments. The patient expressed understanding and agreed to proceed.  I discussed the assessment and treatment plan with the patient. The patient was provided an opportunity to ask questions and all were answered. The patient agreed with the plan and demonstrated an understanding of the instructions.   The patient was advised to call back or seek an in-person evaluation if the symptoms worsen or if the condition fails to improve as anticipated.  I provided 30 minutes of non-face-to-face time during this encounter.  The patient was located at home.  The provider was located at [facility].   Joan Flores, NP   Subjective:   Patient ID:  Jenna Zamora is a 52 y.o. (DOB [birth year]) female.  Chief Complaint: No chief complaint on file.   HPI  52 year old patient presents to this office via video visit for follow up and medication management. No changes this visit. She continues to feel better overall but she has so much circumstantial family trauma occurring with her children continues. She has another daughter who is back using illicit substances. She is afraid that she will lose her too. Daughter is using heroin again.  Did not like the way Pristiq made her feel, so she stopped. She reports her anxiety today at 6/10 and depression at  4/10.  She is sleeping 6-7 hours per night with aid of medication.  No No mania present. No psychosis. No SI/HI.   Past medication failures: Abilify Zoloft   Review of Systems:  Review of Systems  Allergic/Immunologic: Negative.   Neurological: Negative.   Psychiatric/Behavioral:   Positive for dysphoric mood. The patient is nervous/anxious.     Medications: I have reviewed the patient's current medications.  Current Outpatient Medications  Medication Sig Dispense Refill   acetaminophen (TYLENOL) 500 MG tablet Take 500 mg by mouth every 6 (six) hours as needed for mild pain. (Patient not taking: Reported on 01/10/2021)     albuterol (VENTOLIN HFA) 108 (90 Base) MCG/ACT inhaler Inhale 1 puff into the lungs every 4 (four) hours.     ALPRAZolam (XANAX) 1 MG tablet 1/2 to 1 TABLET BY MOUTH THREE TIMES A DAY AS NEEDED FOR ANXIETY 90 tablet 2   azithromycin (ZITHROMAX) 250 MG tablet Take by mouth.     buPROPion (WELLBUTRIN XL) 300 MG 24 hr tablet Take 1 tablet (300 mg total) by mouth daily. 90 tablet 3   busPIRone (BUSPAR) 15 MG tablet TAKE 2 TABLETS BY MOUTH EVERY DAY 180 tablet 2   busPIRone (BUSPAR) 30 MG tablet Take 1 tablet (30 mg total) by mouth 2 (two) times daily. 60 tablet 3   cyclobenzaprine (FLEXERIL) 10 MG tablet Take 1 tablet (10 mg total) by mouth 2 (two) times daily as needed for muscle spasms. (Patient not taking: Reported on 01/10/2021) 20 tablet 0   doxycycline (VIBRAMYCIN) 100 MG capsule Take 1 capsule (100 mg total) by mouth 2 (two) times daily. (Patient not taking: Reported on 01/10/2021) 20 capsule 0   ibuprofen (ADVIL,MOTRIN) 600 MG tablet Take 1 tablet (600 mg total) by mouth every 6 (six) hours as needed. (Patient taking differently: Take 600 mg  by mouth every 6 (six) hours as needed for moderate pain.) 30 tablet 0   ondansetron (ZOFRAN) 4 MG tablet Take 1 tablet (4 mg total) by mouth every 6 (six) hours. 12 tablet 0   potassium chloride SA (K-DUR,KLOR-CON) 20 MEQ tablet Take 1 tablet (20 mEq total) by mouth 2 (two) times daily. 10 tablet 0   prazosin (MINIPRESS) 2 MG capsule Take 4 capsules (8 mg total) by mouth at bedtime. 120 capsule 2   predniSONE (DELTASONE) 20 MG tablet 3 po daily for 2 days, then 2 po daily for 2 days, then 1 po daily for 2 days      rosuvastatin (CRESTOR) 20 MG tablet TAKE 1 TABLET BY MOUTH EVERY DAY 90 tablet 1   rosuvastatin (CRESTOR) 5 MG tablet Take 5 mg by mouth daily.     traZODone (DESYREL) 100 MG tablet TAKE 1 AND 1/2 TABLETS BY MOUTH AT BEDTIME 135 tablet 1   No current facility-administered medications for this visit.    Medication Side Effects: None  Allergies:  Allergies  Allergen Reactions   Morphine And Codeine Anaphylaxis, Hives, Itching and Swelling   Codeine Hives and Itching   Tramadol Hives    Past Medical History:  Diagnosis Date   Anemia    Arthritis    Bipolar 1 disorder (HCC)    Blood transfusion without reported diagnosis    Chronic back pain    Collagen vascular disease (HCC)    DDD (degenerative disc disease)    Emphysema of lung (HCC)    GERD (gastroesophageal reflux disease)    Hyperlipidemia    Lumbar herniated disc    Neuropathy    Panic attacks    Pituitary adenoma (HCC)    PTSD (post-traumatic stress disorder)    Rheumatoid arthritis (HCC)     Family History  Problem Relation Age of Onset   Cancer Mother        ovarian   Heart disease Mother    Bipolar disorder Mother    Cancer Maternal Aunt        breast   Cancer Maternal Grandmother        ovarian   Bipolar disorder Daughter     Social History   Socioeconomic History   Marital status: Married    Spouse name: Not on file   Number of children: Not on file   Years of education: Not on file   Highest education level: Not on file  Occupational History   Not on file  Tobacco Use   Smoking status: Every Day    Current packs/day: 1.00    Average packs/day: 1 pack/day for 17.6 years (17.6 ttl pk-yrs)    Types: Cigarettes    Start date: 07/28/2005   Smokeless tobacco: Never  Vaping Use   Vaping status: Former  Substance and Sexual Activity   Alcohol use: No   Drug use: No   Sexual activity: Yes    Partners: Male  Other Topics Concern   Not on file  Social History Narrative   Not on file   Social  Determinants of Health   Financial Resource Strain: Not on file  Food Insecurity: Not on file  Transportation Needs: Not on file  Physical Activity: Not on file  Stress: Not on file  Social Connections: Not on file  Intimate Partner Violence: Not on file    Past Medical History, Surgical history, Social history, and Family history were reviewed and updated as appropriate.   Please see review of  systems for further details on the patient's review from today.   Objective:   Physical Exam:  There were no vitals taken for this visit.  Physical Exam Psychiatric:        Attention and Perception: Attention and perception normal.        Mood and Affect: Mood and affect normal.        Speech: Speech normal.        Behavior: Behavior normal. Behavior is cooperative.        Cognition and Memory: Cognition and memory normal.        Judgment: Judgment normal.     Lab Review:     Component Value Date/Time   NA 138 09/28/2019 2054   K 3.7 09/28/2019 2054   CL 107 09/28/2019 2054   CO2 23 09/28/2019 2054   GLUCOSE 100 (H) 09/28/2019 2054   BUN 7 09/28/2019 2054   CREATININE 0.76 09/28/2019 2054   CALCIUM 8.7 (L) 09/28/2019 2054   PROT 7.5 06/13/2018 1643   ALBUMIN 4.2 06/13/2018 1643   AST 20 06/13/2018 1643   ALT 15 06/13/2018 1643   ALKPHOS 67 06/13/2018 1643   BILITOT 0.9 06/13/2018 1643   GFRNONAA >60 09/28/2019 2054   GFRAA >60 09/28/2019 2054       Component Value Date/Time   WBC 7.3 09/28/2019 2054   RBC 3.89 09/28/2019 2054   HGB 12.6 09/28/2019 2054   HCT 38.0 09/28/2019 2054   PLT 197 09/28/2019 2054   MCV 97.7 09/28/2019 2054   MCH 32.4 09/28/2019 2054   MCHC 33.2 09/28/2019 2054   RDW 12.7 09/28/2019 2054   LYMPHSABS 2.3 12/12/2016 1620   MONOABS 0.3 12/12/2016 1620   EOSABS 0.1 12/12/2016 1620   BASOSABS 0.0 12/12/2016 1620    No results found for: "POCLITH", "LITHIUM"   No results found for: "PHENYTOIN", "PHENOBARB", "VALPROATE", "CBMZ"    .res Assessment: Plan:   Greater than 50% of 30 min. of via video visit time with patient was spent on counseling and coordination of care. We discussed her progress with grief. Still has one daughter on heroin and fentanyl. Worries about this daughter OD. Living in a constant state of stress. We talked about her current stability and medication regimen.  She has been consistent with appointments.    Continue Xanax 0.5-1 mg tablet three times daily prn.  This is temporary while she is dealing with increased anxiety from death of her daughter.  Stopped Pristiq She understands that we will need to decrease again after we get her a little more stable with additional non benzo's.  Stopped Trazodone Stopped Mirtazapine To continue  prazosin to 8 mg at bedtime. Continue Wellbutrin  to 300 mg XL daily Will report and side effects or worsening symptoms promptly. Will follow up in 3 months to reassess Provided emergency contact information Recommended psychotherapy but patient says that she is not in the position to receive Discussed potential benefits, risk, and side effects of benzodiazepines to include potential risk of tolerance and dependence, as well as possible drowsiness.  Advised patient not to drive if experiencing drowsiness and to take lowest possible effective dose to minimize risk of dependence and tolerance. Reviewed PDMP  Watt Climes. Tityana Pagan, NP       Diagnoses and all orders for this visit:  Generalized anxiety disorder -     ALPRAZolam (XANAX) 1 MG tablet; 1/2 to 1 TABLET BY MOUTH THREE TIMES A DAY AS NEEDED FOR ANXIETY -  buPROPion (WELLBUTRIN XL) 300 MG 24 hr tablet; Take 1 tablet (300 mg total) by mouth daily. -     busPIRone (BUSPAR) 30 MG tablet; Take 1 tablet (30 mg total) by mouth 2 (two) times daily.  Major depressive disorder, recurrent episode, moderate (HCC) -     buPROPion (WELLBUTRIN XL) 300 MG 24 hr tablet; Take 1 tablet (300 mg total) by mouth daily. -      busPIRone (BUSPAR) 30 MG tablet; Take 1 tablet (30 mg total) by mouth 2 (two) times daily.  PTSD (post-traumatic stress disorder) -     prazosin (MINIPRESS) 2 MG capsule; Take 4 capsules (8 mg total) by mouth at bedtime.     Please see After Visit Summary for patient specific instructions.  Future Appointments  Date Time Provider Department Center  03/10/2023  3:00 PM Avelina Laine A, NP [facility] None    No orders of the defined types were placed in this encounter.     -------------------------------

## 2023-03-10 ENCOUNTER — Ambulatory Visit: Payer: Medicare HMO | Admitting: Behavioral Health

## 2023-06-05 ENCOUNTER — Other Ambulatory Visit: Payer: Self-pay | Admitting: Behavioral Health

## 2023-06-05 DIAGNOSIS — F4310 Post-traumatic stress disorder, unspecified: Secondary | ICD-10-CM

## 2023-06-09 ENCOUNTER — Ambulatory Visit: Payer: Medicare HMO | Admitting: Behavioral Health

## 2023-06-10 ENCOUNTER — Other Ambulatory Visit: Payer: Self-pay | Admitting: Behavioral Health

## 2023-06-10 DIAGNOSIS — F411 Generalized anxiety disorder: Secondary | ICD-10-CM

## 2023-06-12 ENCOUNTER — Encounter: Payer: Self-pay | Admitting: Behavioral Health

## 2023-06-12 ENCOUNTER — Telehealth: Payer: Medicare HMO | Admitting: Behavioral Health

## 2023-06-12 DIAGNOSIS — F4310 Post-traumatic stress disorder, unspecified: Secondary | ICD-10-CM | POA: Diagnosis not present

## 2023-06-12 DIAGNOSIS — F5105 Insomnia due to other mental disorder: Secondary | ICD-10-CM

## 2023-06-12 DIAGNOSIS — F411 Generalized anxiety disorder: Secondary | ICD-10-CM | POA: Diagnosis not present

## 2023-06-12 DIAGNOSIS — F99 Mental disorder, not otherwise specified: Secondary | ICD-10-CM

## 2023-06-12 DIAGNOSIS — F331 Major depressive disorder, recurrent, moderate: Secondary | ICD-10-CM

## 2023-06-12 MED ORDER — BUPROPION HCL ER (XL) 150 MG PO TB24: ORAL_TABLET | ORAL | 1 refills | Status: DC

## 2023-06-12 MED ORDER — PRAZOSIN HCL 2 MG PO CAPS: 8.0000 mg | ORAL_CAPSULE | Freq: Every day | ORAL | 1 refills | Status: DC

## 2023-06-12 MED ORDER — BUSPIRONE HCL 30 MG PO TABS
30.0000 mg | ORAL_TABLET | Freq: Two times a day (BID) | ORAL | 3 refills | Status: DC
Start: 2023-06-12 — End: 2023-08-13

## 2023-06-12 MED ORDER — BUPROPION HCL ER (XL) 300 MG PO TB24
300.0000 mg | ORAL_TABLET | Freq: Every day | ORAL | 3 refills | Status: DC
Start: 2023-06-12 — End: 2023-08-13

## 2023-06-12 MED ORDER — TRAZODONE HCL 100 MG PO TABS
ORAL_TABLET | ORAL | 1 refills | Status: DC
Start: 2023-06-12 — End: 2023-08-13

## 2023-06-12 NOTE — Progress Notes (Signed)
Jenna Zamora [id number] [birth year] 52 y.o.  Virtual Visit via Video Note  I connected with pt @ on 06/12/23 at  1:30 PM EST by a video enabled telemedicine application and verified that I am speaking with the correct person using two identifiers.   I discussed the limitations of evaluation and management by telemedicine and the availability of in person appointments. The patient expressed understanding and agreed to proceed.  I discussed the assessment and treatment plan with the patient. The patient was provided an opportunity to ask questions and all were answered. The patient agreed with the plan and demonstrated an understanding of the instructions.   The patient was advised to call back or seek an in-person evaluation if the symptoms worsen or if the condition fails to improve as anticipated.  I provided 30 minutes of non-face-to-face time during this encounter.  The patient was located at home.  The provider was located at [facility].   Joan Flores, NP   Subjective:   Patient ID:  Jenna Zamora is a 52 y.o. (DOB 09/08/1970) female.  Chief Complaint:  Chief Complaint  Patient presents with   Anxiety   Depression   Follow-up   Medication Refill   Family Problem   Patient Education   Stress    HPI 52  year old patient presents to this office for follow up and medication management. She continues to feel better overall but she has so much circumstantial family trauma occurring with her children continues. She has another daughter who is back using illicit substances. She is afraid that she will lose her too.  Says her AD is helping her process but feels like she needs medication increase this visit. She reports her anxiety today at 5/10 and depression at  4/10.  She is sleeping 6-7 hours per night with aid of medication.  No No mania present. No psychosis. No SI/HI.   Past medication failures: Abilify Zoloft   Review of Systems:  Review of Systems   Constitutional: Negative.   Allergic/Immunologic: Negative.   Psychiatric/Behavioral:  Positive for dysphoric mood. The patient is nervous/anxious.     Medications: I have reviewed the patient's current medications.  Current Outpatient Medications  Medication Sig Dispense Refill   buPROPion (WELLBUTRIN XL) 150 MG 24 hr tablet Take one tablet by mouth daily. 90 tablet 1   acetaminophen (TYLENOL) 500 MG tablet Take 500 mg by mouth every 6 (six) hours as needed for mild pain. (Patient not taking: Reported on 01/10/2021)     albuterol (VENTOLIN HFA) 108 (90 Base) MCG/ACT inhaler Inhale 1 puff into the lungs every 4 (four) hours.     ALPRAZolam (XANAX) 1 MG tablet TAKE 1/2 TO 1 TABLET BY MOUTH 3 TIMES A DAY AS NEEDED FOR ANXIETY 90 tablet 2   azithromycin (ZITHROMAX) 250 MG tablet Take by mouth.     buPROPion (WELLBUTRIN XL) 300 MG 24 hr tablet Take 1 tablet (300 mg total) by mouth daily. 90 tablet 3   busPIRone (BUSPAR) 30 MG tablet Take 1 tablet (30 mg total) by mouth 2 (two) times daily. 60 tablet 3   cyclobenzaprine (FLEXERIL) 10 MG tablet Take 1 tablet (10 mg total) by mouth 2 (two) times daily as needed for muscle spasms. (Patient not taking: Reported on 01/10/2021) 20 tablet 0   doxycycline (VIBRAMYCIN) 100 MG capsule Take 1 capsule (100 mg total) by mouth 2 (two) times daily. (Patient not taking: Reported on 01/10/2021) 20 capsule 0   ibuprofen (ADVIL,MOTRIN)  600 MG tablet Take 1 tablet (600 mg total) by mouth every 6 (six) hours as needed. (Patient taking differently: Take 600 mg by mouth every 6 (six) hours as needed for moderate pain.) 30 tablet 0   ondansetron (ZOFRAN) 4 MG tablet Take 1 tablet (4 mg total) by mouth every 6 (six) hours. 12 tablet 0   potassium chloride SA (K-DUR,KLOR-CON) 20 MEQ tablet Take 1 tablet (20 mEq total) by mouth 2 (two) times daily. 10 tablet 0   prazosin (MINIPRESS) 2 MG capsule Take 4 capsules (8 mg total) by mouth at bedtime. 360 capsule 1   predniSONE  (DELTASONE) 20 MG tablet 3 po daily for 2 days, then 2 po daily for 2 days, then 1 po daily for 2 days     rosuvastatin (CRESTOR) 20 MG tablet TAKE 1 TABLET BY MOUTH EVERY DAY 90 tablet 1   rosuvastatin (CRESTOR) 5 MG tablet Take 5 mg by mouth daily.     traZODone (DESYREL) 100 MG tablet Take two tablets by mouth at bedtime daily for sleep and anxiety 180 tablet 1   No current facility-administered medications for this visit.    Medication Side Effects: None  Allergies:  Allergies  Allergen Reactions   Morphine And Codeine Anaphylaxis, Hives, Itching and Swelling   Codeine Hives and Itching   Tramadol Hives    Past Medical History:  Diagnosis Date   Anemia    Arthritis    Bipolar 1 disorder (HCC)    Blood transfusion without reported diagnosis    Chronic back pain    Collagen vascular disease (HCC)    DDD (degenerative disc disease)    Emphysema of lung (HCC)    GERD (gastroesophageal reflux disease)    Hyperlipidemia    Lumbar herniated disc    Neuropathy    Panic attacks    Pituitary adenoma (HCC)    PTSD (post-traumatic stress disorder)    Rheumatoid arthritis (HCC)     Family History  Problem Relation Age of Onset   Cancer Mother        ovarian   Heart disease Mother    Bipolar disorder Mother    Cancer Maternal Aunt        breast   Cancer Maternal Grandmother        ovarian   Bipolar disorder Daughter     Social History   Socioeconomic History   Marital status: Married    Spouse name: Not on file   Number of children: Not on file   Years of education: Not on file   Highest education level: Not on file  Occupational History   Not on file  Tobacco Use   Smoking status: Every Day    Current packs/day: 1.00    Average packs/day: 1 pack/day for 17.9 years (17.9 ttl pk-yrs)    Types: Cigarettes    Start date: 07/28/2005   Smokeless tobacco: Never  Vaping Use   Vaping status: Former  Substance and Sexual Activity   Alcohol use: No   Drug use: No    Sexual activity: Yes    Partners: Male  Other Topics Concern   Not on file  Social History Narrative   Not on file   Social Determinants of Health   Financial Resource Strain: Not on file  Food Insecurity: Not on file  Transportation Needs: Not on file  Physical Activity: Not on file  Stress: Not on file  Social Connections: Not on file  Intimate Partner Violence: Not on  file    Past Medical History, Surgical history, Social history, and Family history were reviewed and updated as appropriate.   Please see review of systems for further details on the patient's review from today.   Objective:   Physical Exam:  There were no vitals taken for this visit.  Physical Exam Psychiatric:        Attention and Perception: Attention and perception normal.        Mood and Affect: Mood and affect normal.        Speech: Speech normal.        Behavior: Behavior normal. Behavior is cooperative.        Cognition and Memory: Cognition and memory normal.        Judgment: Judgment normal.     Lab Review:     Component Value Date/Time   NA 138 09/28/2019 2054   K 3.7 09/28/2019 2054   CL 107 09/28/2019 2054   CO2 23 09/28/2019 2054   GLUCOSE 100 (H) 09/28/2019 2054   BUN 7 09/28/2019 2054   CREATININE 0.76 09/28/2019 2054   CALCIUM 8.7 (L) 09/28/2019 2054   PROT 7.5 06/13/2018 1643   ALBUMIN 4.2 06/13/2018 1643   AST 20 06/13/2018 1643   ALT 15 06/13/2018 1643   ALKPHOS 67 06/13/2018 1643   BILITOT 0.9 06/13/2018 1643   GFRNONAA >60 09/28/2019 2054   GFRAA >60 09/28/2019 2054       Component Value Date/Time   WBC 7.3 09/28/2019 2054   RBC 3.89 09/28/2019 2054   HGB 12.6 09/28/2019 2054   HCT 38.0 09/28/2019 2054   PLT 197 09/28/2019 2054   MCV 97.7 09/28/2019 2054   MCH 32.4 09/28/2019 2054   MCHC 33.2 09/28/2019 2054   RDW 12.7 09/28/2019 2054   LYMPHSABS 2.3 12/12/2016 1620   MONOABS 0.3 12/12/2016 1620   EOSABS 0.1 12/12/2016 1620   BASOSABS 0.0 12/12/2016 1620     No results found for: "POCLITH", "LITHIUM"   No results found for: "PHENYTOIN", "PHENOBARB", "VALPROATE", "CBMZ"   .res Assessment: Plan:    Greater than 50% of 30 min. of face to face time with patient was spent on counseling and coordination of care. We discussed her progress with grief. We talked about her current stability and medication regimen. Continued grief and stressful family dynamics. Family members involved in substance abuse.   She has been consistent with appointments.    Continue Xanax 0.5-1 mg tablet three times daily prn.  This is temporary while she is dealing with increased anxiety from death of her daughter.  She understands that we will need to decrease again after we get her a little more stable with additional non benzo's.  Stopped Trazodone To start Mirtazapine 15 mg at bedtime for sleep To increase prazosin to 8 mg at bedtime. Increase Wellbutrin  to 450  mg XL daily Will report and side effects or worsening symptoms promptly. Will follow up in 3 months to reassess Provided emergency contact information Recommended psychotherapy but patient says that she is not in the position to receive Discussed potential benefits, risk, and side effects of benzodiazepines to include potential risk of tolerance and dependence, as well as possible drowsiness.  Advised patient not to drive if experiencing drowsiness and to take lowest possible effective dose to minimize risk of dependence and tolerance. Reviewed PDMP          Zakiyah was seen today for anxiety, depression, follow-up, medication refill, family problem, patient education and stress.  Diagnoses and all orders for this visit:  Generalized anxiety disorder -     busPIRone (BUSPAR) 30 MG tablet; Take 1 tablet (30 mg total) by mouth 2 (two) times daily. -     buPROPion (WELLBUTRIN XL) 300 MG 24 hr tablet; Take 1 tablet (300 mg total) by mouth daily. -     buPROPion (WELLBUTRIN XL) 150 MG 24 hr tablet;  Take one tablet by mouth daily. -     traZODone (DESYREL) 100 MG tablet; Take two tablets by mouth at bedtime daily for sleep and anxiety  Major depressive disorder, recurrent episode, moderate (HCC) -     busPIRone (BUSPAR) 30 MG tablet; Take 1 tablet (30 mg total) by mouth 2 (two) times daily. -     buPROPion (WELLBUTRIN XL) 300 MG 24 hr tablet; Take 1 tablet (300 mg total) by mouth daily. -     buPROPion (WELLBUTRIN XL) 150 MG 24 hr tablet; Take one tablet by mouth daily. -     traZODone (DESYREL) 100 MG tablet; Take two tablets by mouth at bedtime daily for sleep and anxiety  Insomnia due to other mental disorder -     traZODone (DESYREL) 100 MG tablet; Take two tablets by mouth at bedtime daily for sleep and anxiety  PTSD (post-traumatic stress disorder) -     prazosin (MINIPRESS) 2 MG capsule; Take 4 capsules (8 mg total) by mouth at bedtime.     Please see After Visit Summary for patient specific instructions.  No future appointments.  No orders of the defined types were placed in this encounter.     -------------------------------

## 2023-08-10 ENCOUNTER — Other Ambulatory Visit: Payer: Self-pay | Admitting: Behavioral Health

## 2023-08-10 DIAGNOSIS — E7800 Pure hypercholesterolemia, unspecified: Secondary | ICD-10-CM

## 2023-08-12 NOTE — Telephone Encounter (Signed)
 I will bridge one more time but she will need to have PCP RX this next time

## 2023-08-13 ENCOUNTER — Ambulatory Visit: Payer: Medicare HMO | Admitting: Behavioral Health

## 2023-08-13 ENCOUNTER — Encounter: Payer: Self-pay | Admitting: Behavioral Health

## 2023-08-13 DIAGNOSIS — F4310 Post-traumatic stress disorder, unspecified: Secondary | ICD-10-CM

## 2023-08-13 DIAGNOSIS — F331 Major depressive disorder, recurrent, moderate: Secondary | ICD-10-CM

## 2023-08-13 DIAGNOSIS — F5105 Insomnia due to other mental disorder: Secondary | ICD-10-CM | POA: Diagnosis not present

## 2023-08-13 DIAGNOSIS — F99 Mental disorder, not otherwise specified: Secondary | ICD-10-CM

## 2023-08-13 DIAGNOSIS — F411 Generalized anxiety disorder: Secondary | ICD-10-CM

## 2023-08-13 MED ORDER — ALPRAZOLAM 1 MG PO TABS
ORAL_TABLET | ORAL | 2 refills | Status: DC
Start: 2023-08-13 — End: 2023-11-27

## 2023-08-13 MED ORDER — BUSPIRONE HCL 30 MG PO TABS: 30.0000 mg | ORAL_TABLET | Freq: Two times a day (BID) | ORAL | 1 refills | Status: DC

## 2023-08-13 MED ORDER — TRAZODONE HCL 100 MG PO TABS
ORAL_TABLET | ORAL | 1 refills | Status: DC
Start: 2023-08-13 — End: 2023-12-11

## 2023-08-13 MED ORDER — BUPROPION HCL ER (XL) 300 MG PO TB24: 300.0000 mg | ORAL_TABLET | Freq: Every day | ORAL | 1 refills | Status: DC

## 2023-08-13 MED ORDER — BUPROPION HCL ER (XL) 150 MG PO TB24
ORAL_TABLET | ORAL | 1 refills | Status: DC
Start: 2023-08-13 — End: 2023-12-11

## 2023-08-13 MED ORDER — PRAZOSIN HCL 2 MG PO CAPS: 8.0000 mg | ORAL_CAPSULE | Freq: Every day | ORAL | 1 refills | Status: DC

## 2023-08-13 NOTE — Progress Notes (Signed)
[facility]  Patient ID: Jenna Zamora,  MRN: [id number]  PCP: Darrow Bussing, MD  Date of Evaluation: 08/13/2023 Time spent:30 minutes  Chief Complaint:  Chief Complaint   Anxiety; Depression; Follow-up; Medication Refill; Patient Education; Family Problem; Stress     HISTORY/CURRENT STATUS: HPI  53  year old patient presents to this office for follow up and medication management. She continues to feel better overall but she has so much circumstantial family trauma occurring with her children continues. She has another daughter who is back using illicit substances. She is afraid that she will lose her too.  Says her AD is helping her process but feels like she needs medication increase this visit. She reports her anxiety today at 5/10 and depression at  4/10.  She is sleeping 6-7 hours per night with aid of medication.  No No mania present. No psychosis. No SI/HI.   Past medication failures: Abilify Zoloft   Individual Medical History/ Review of Systems: Changes? :No   Allergies: Morphine and codeine, Codeine, and Tramadol  Current Medications:  Current Outpatient Medications:    acetaminophen (TYLENOL) 500 MG tablet, Take 500 mg by mouth every 6 (six) hours as needed for mild pain. (Patient not taking: Reported on 01/10/2021), Disp: , Rfl:    albuterol (VENTOLIN HFA) 108 (90 Base) MCG/ACT inhaler, Inhale 1 puff into the lungs every 4 (four) hours., Disp: , Rfl:    ALPRAZolam (XANAX) 1 MG tablet, TAKE 1/2 TO 1 TABLET BY MOUTH 3 TIMES A DAY AS NEEDED FOR ANXIETY, Disp: 90 tablet, Rfl: 2   azithromycin (ZITHROMAX) 250 MG tablet, Take by mouth., Disp: , Rfl:    buPROPion (WELLBUTRIN XL) 150 MG 24 hr tablet, Take one tablet by mouth daily., Disp: 90 tablet, Rfl: 1   buPROPion (WELLBUTRIN XL) 300 MG 24 hr tablet, Take 1 tablet (300 mg total) by mouth daily., Disp: 90 tablet, Rfl: 3   busPIRone (BUSPAR) 30 MG tablet, Take 1 tablet (30 mg total) by mouth 2 (two) times  daily., Disp: 60 tablet, Rfl: 3   cyclobenzaprine (FLEXERIL) 10 MG tablet, Take 1 tablet (10 mg total) by mouth 2 (two) times daily as needed for muscle spasms. (Patient not taking: Reported on 01/10/2021), Disp: 20 tablet, Rfl: 0   doxycycline (VIBRAMYCIN) 100 MG capsule, Take 1 capsule (100 mg total) by mouth 2 (two) times daily. (Patient not taking: Reported on 01/10/2021), Disp: 20 capsule, Rfl: 0   ibuprofen (ADVIL,MOTRIN) 600 MG tablet, Take 1 tablet (600 mg total) by mouth every 6 (six) hours as needed. (Patient taking differently: Take 600 mg by mouth every 6 (six) hours as needed for moderate pain.), Disp: 30 tablet, Rfl: 0   ondansetron (ZOFRAN) 4 MG tablet, Take 1 tablet (4 mg total) by mouth every 6 (six) hours., Disp: 12 tablet, Rfl: 0   potassium chloride SA (K-DUR,KLOR-CON) 20 MEQ tablet, Take 1 tablet (20 mEq total) by mouth 2 (two) times daily., Disp: 10 tablet, Rfl: 0   prazosin (MINIPRESS) 2 MG capsule, Take 4 capsules (8 mg total) by mouth at bedtime., Disp: 360 capsule, Rfl: 1   predniSONE (DELTASONE) 20 MG tablet, 3 po daily for 2 days, then 2 po daily for 2 days, then 1 po daily for 2 days, Disp: , Rfl:    rosuvastatin (CRESTOR) 20 MG tablet, TAKE 1 TABLET BY MOUTH EVERY DAY, Disp: 90 tablet, Rfl: 1   rosuvastatin (CRESTOR) 5 MG tablet, Take 5 mg by mouth daily., Disp: , Rfl:  traZODone (DESYREL) 100 MG tablet, Take two tablets by mouth at bedtime daily for sleep and anxiety, Disp: 180 tablet, Rfl: 1 Medication Side Effects: none  Family Medical/ Social History: Changes? No  MENTAL HEALTH EXAM:  There were no vitals taken for this visit.There is no height or weight on file to calculate BMI.  General Appearance: Casual, Neat, and Well Groomed  Eye Contact:  Good  Speech:  Clear and Coherent  Volume:  Normal  Mood:  NA  Affect:  Appropriate  Thought Process:  Coherent  Orientation:  Full (Time, Place, and Person)  Thought Content: Logical   Suicidal Thoughts:  No   Homicidal Thoughts:  No  Memory:  WNL  Judgement:  Good  Insight:  Good  Psychomotor Activity:  NA  Concentration:  Concentration: Good  Recall:  Good  Fund of Knowledge: Good  Language: Good  Assets:  Desire for Improvement  ADL's:  Intact  Cognition: WNL  Prognosis:  Good    DIAGNOSES:    ICD-10-CM   1. Generalized anxiety disorder  F41.1     2. Major depressive disorder, recurrent episode, moderate (HCC)  F33.1     3. Insomnia due to other mental disorder  F51.05    F99     4. PTSD (post-traumatic stress disorder)  F43.10       Receiving Psychotherapy: No    RECOMMENDATIONS:   Greater than 50% of 30 min. of face to face time with patient was spent on counseling and coordination of care. We discussed her progress with grief. Her oldest daughter Jenna Zamora is doing fentanyl again. Son is home from rehab. Raising old the grandchildren.   We talked about her current stability and medication regimen. Continued grief and stressful family dynamics. Family members involved in substance abuse.   She has been consistent with appointments.    Continue Xanax 0.5-1 mg tablet three times daily prn.  This is temporary while she is dealing with increased anxiety from death of her daughter.  She understands that we will need to decrease again after we get her a little more stable with additional non benzo's.  Stopped Trazodone To start Mirtazapine 15 mg at bedtime for sleep To increase prazosin to 8 mg at bedtime. Continue Wellbutrin  to 450  mg XL daily Will report and side effects or worsening symptoms promptly. Will follow up in 3 months to reassess Provided emergency contact information Recommended psychotherapy but patient says that she is not in the position to receive Discussed potential benefits, risk, and side effects of benzodiazepines to include potential risk of tolerance and dependence, as well as possible drowsiness.  Advised patient not to drive if experiencing drowsiness  and to take lowest possible effective dose to minimize risk of dependence and tolerance. Reviewed PDMP       Joan Flores, NP

## 2023-09-14 ENCOUNTER — Other Ambulatory Visit: Payer: Self-pay | Admitting: Behavioral Health

## 2023-09-14 DIAGNOSIS — F5105 Insomnia due to other mental disorder: Secondary | ICD-10-CM

## 2023-09-14 DIAGNOSIS — F331 Major depressive disorder, recurrent, moderate: Secondary | ICD-10-CM

## 2023-09-14 DIAGNOSIS — F411 Generalized anxiety disorder: Secondary | ICD-10-CM

## 2023-11-04 ENCOUNTER — Other Ambulatory Visit: Payer: Self-pay | Admitting: Behavioral Health

## 2023-11-04 DIAGNOSIS — F411 Generalized anxiety disorder: Secondary | ICD-10-CM

## 2023-11-04 DIAGNOSIS — F331 Major depressive disorder, recurrent, moderate: Secondary | ICD-10-CM

## 2023-11-05 ENCOUNTER — Other Ambulatory Visit: Payer: Self-pay

## 2023-11-05 ENCOUNTER — Emergency Department (HOSPITAL_COMMUNITY)
Admission: EM | Admit: 2023-11-05 | Discharge: 2023-11-06 | Disposition: A | Discharge status: Home / Self Care | Attending: Emergency Medicine | Admitting: Emergency Medicine

## 2023-11-05 DIAGNOSIS — M545 Low back pain, unspecified: Secondary | ICD-10-CM | POA: Insufficient documentation

## 2023-11-05 DIAGNOSIS — R102 Pelvic and perineal pain: Secondary | ICD-10-CM | POA: Insufficient documentation

## 2023-11-05 DIAGNOSIS — W19XXXA Unspecified fall, initial encounter: Secondary | ICD-10-CM

## 2023-11-05 DIAGNOSIS — W1839XA Other fall on same level, initial encounter: Secondary | ICD-10-CM | POA: Diagnosis not present

## 2023-11-05 NOTE — ED Triage Notes (Signed)
 Pt c/o bilateral hip pain after falling over her dogs chew toy, states bilateral hip pain radiates down both legs, rates pain 10/10. Pt was able to ambulate back here to triage, no shortening noticed

## 2023-11-06 ENCOUNTER — Emergency Department (HOSPITAL_COMMUNITY)

## 2023-11-06 DIAGNOSIS — M545 Low back pain, unspecified: Secondary | ICD-10-CM | POA: Diagnosis not present

## 2023-11-06 MED ORDER — OXYCODONE-ACETAMINOPHEN 5-325 MG PO TABS
2.0000 | ORAL_TABLET | Freq: Once | ORAL | Status: AC
  Administered 2023-11-06: 2 via ORAL
  Filled 2023-11-06: qty 2

## 2023-11-06 MED ORDER — OXYCODONE-ACETAMINOPHEN 5-325 MG PO TABS: 1.0000 | ORAL_TABLET | Freq: Three times a day (TID) | ORAL | 0 refills | Status: DC | PRN

## 2023-11-06 NOTE — ED Provider Notes (Signed)
 [place] EMERGENCY DEPARTMENT AT [facility] Provider Note   CSN: [id number] Arrival date & time: 11/05/23  1725     History  Chief Complaint  Patient presents with   Hip Pain    Jenna Zamora is a 53 y.o. female.  Patient stepped on a ball yesterday and fell backwards landing on her pelvis.  She states that she has pain in her lower back, bilateral gluteal region and less so radiating down the backs of both legs.  She is able to ambulate.  She has not take anything strong for pain medication.  She did try Tylenol which helped a little bit.  No incontinence.  No other associated symptoms.  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   Hip Pain       Home Medications Prior to Admission medications   Medication Sig Start Date End Date Taking? Authorizing Provider  oxyCODONE-acetaminophen (PERCOCET) 5-325 MG tablet Take 1 tablet by mouth every 8 (eight) hours as needed for severe pain (pain score 7-10). 11/06/23  Yes Raquell Richer, Reymundo Caulk, MD  acetaminophen (TYLENOL) 500 MG tablet Take 500 mg by mouth every 6 (six) hours as needed for mild pain. Patient not taking: Reported on 01/10/2021    [provider]  albuterol (VENTOLIN HFA) 108 (90 Base) MCG/ACT inhaler Inhale 1 puff into the lungs every 4 (four) hours. 12/21/20   [provider]  ALPRAZolam [place]) 1 MG tablet TAKE 1/2 TO 1 TABLET BY MOUTH 3 TIMES A DAY AS NEEDED FOR ANXIETY 08/13/23   Lincoln Renshaw, NP  azithromycin (ZITHROMAX) 250 MG tablet Take by mouth. 12/23/20   [provider]  buPROPion (WELLBUTRIN XL) 150 MG 24 hr tablet Take one tablet by mouth daily. 08/13/23   Lincoln Renshaw, NP  buPROPion (WELLBUTRIN XL) 300 MG 24 hr tablet Take 1 tablet (300 mg total) by mouth daily. 08/13/23   Lincoln Renshaw, NP  busPIRone (BUSPAR) 30 MG tablet Take 1 tablet (30 mg total) by mouth 2 (two)  times daily. 08/13/23   Lincoln Renshaw, NP  cyclobenzaprine (FLEXERIL) 10 MG tablet Take 1 tablet (10 mg total) by mouth 2 (two) times daily as needed for muscle spasms. Patient not taking: Reported on 01/10/2021 05/31/18   Law, Alexandra M, PA-C  doxycycline (VIBRAMYCIN) 100 MG capsule Take 1 capsule (100 mg total) by mouth 2 (two) times daily. Patient not taking: Reported on 01/10/2021 11/13/18   Idol, Julie, PA-C  ibuprofen (ADVIL,MOTRIN) 600 MG tablet Take 1 tablet (600 mg total) by mouth every 6 (six) hours as needed. Patient taking differently: Take 600 mg by mouth every 6 (six) hours as needed for moderate pain. 05/31/18   Law, Alexandra M, PA-C  ondansetron (ZOFRAN) 4 MG tablet Take 1 tablet (4 mg total) by mouth every 6 (six) hours. 06/13/18   Triplett, Tammy, PA-C  potassium chloride SA (K-DUR,KLOR-CON) 20 MEQ tablet Take 1 tablet (20 mEq total) by mouth 2 (two) times daily. 06/13/18   Triplett, Tammy, PA-C  prazosin (MINIPRESS) 2 MG capsule Take 4 capsules (8 mg total) by mouth at bedtime. 08/13/23   Lincoln Renshaw, NP  predniSONE (DELTASONE) 20 MG tablet 3 po daily for 2 days, then 2 po daily for 2 days, then 1 po daily for 2 days 12/23/20   [provider]  rosuvastatin (CRESTOR) 20 MG tablet TAKE 1  TABLET BY MOUTH EVERY DAY 08/12/23   Marita Sidle A, NP  rosuvastatin (CRESTOR) 5 MG tablet Take 5 mg by mouth daily. 10/03/20   [provider]  traZODone (DESYREL) 100 MG tablet Take two tablets by mouth at bedtime daily for sleep and anxiety 08/13/23   Lincoln Renshaw, NP      Allergies    Morphine and codeine, Codeine, and Tramadol    Review of Systems   Review of Systems  Physical Exam Updated Vital Signs BP (!) 140/83 (BP Location: Right Arm)   Pulse 64   Temp 97.7 F (36.5 C) (Oral)   Resp 16   SpO2 100%  Physical Exam Vitals and nursing note reviewed.  Constitutional:      Appearance: She is well-developed.  HENT:     Head: Normocephalic and atraumatic.   Cardiovascular:     Rate and Rhythm: Normal rate and regular rhythm.  Pulmonary:     Effort: No respiratory distress.     Breath sounds: No stridor.  Abdominal:     General: There is no distension.  Musculoskeletal:        General: Tenderness present.     Cervical back: Normal range of motion.     Comments: To palpation lower lumbar spine and bilateral iliac crests  Neurological:     General: No focal deficit present.     Mental Status: She is alert.     Comments: Antalgic gait but is able to stand, ambulate on her own.     ED Results / Procedures / Treatments   Labs (all labs ordered are listed, but only abnormal results are displayed) Labs Reviewed - No data to display  EKG None  Radiology CT PELVIS WO CONTRAST Result Date: 11/06/2023 CLINICAL DATA:  Hip trauma fall EXAM: CT PELVIS WITHOUT CONTRAST TECHNIQUE: Multidetector CT imaging of the pelvis was performed following the standard protocol without intravenous contrast. RADIATION DOSE REDUCTION: This exam was performed according to the departmental dose-optimization program which includes automated exposure control, adjustment of the mA and/or kV according to patient size and/or use of iterative reconstruction technique. COMPARISON:  Radiograph 05/31/2018 FINDINGS: Urinary Tract:  No abnormality visualized. Bowel:  No acute bowel wall thickening Vascular/Lymphatic: No aneurysm.  No suspicious lymph nodes Reproductive:  Uterus unremarkable.  3.4 by 2 cm right adnexal cyst Other: Negative pelvic effusion or free air. Asymmetrical thickening along the left lower quadrant rectus with scattered calcification, may be from prior hernia repair. Musculoskeletal: No acute or suspicious osseous lesion. Chronic appearing deformity at the left anterior iliac bone IMPRESSION: 1. No CT evidence for acute osseous abnormality. 2. 3.4 cm right adnexal cyst. Recommend follow-up US  in 6-12 months. Note: This recommendation does not apply to  premenarchal patients and to those with increased risk (genetic, family history, elevated tumor markers or other high-risk factors) of ovarian cancer. Reference: JACR 2020 Feb; 17(2):248-254 Electronically Signed   By: Esmeralda Hedge M.D.   On: 11/06/2023 02:35   CT Lumbar Spine Wo Contrast Result Date: 11/06/2023 CLINICAL DATA:  Back trauma fall EXAM: CT LUMBAR SPINE WITHOUT CONTRAST TECHNIQUE: Multidetector CT imaging of the lumbar spine was performed without intravenous contrast administration. Multiplanar CT image reconstructions were also generated. RADIATION DOSE REDUCTION: This exam was performed according to the departmental dose-optimization program which includes automated exposure control, adjustment of the mA and/or kV according to patient size and/or use of iterative reconstruction technique. COMPARISON:  Radiograph 03/25/2021 FINDINGS: Segmentation: 5 lumbar type vertebrae. Alignment: Normal. Vertebrae:  Mild chronic compression deformity of L1. No acute fracture is seen. Paraspinal and other soft tissues: No acute paravertebral or paraspinal soft tissue abnormality. Disc levels: At L1-L2, patent disc space. No canal stenosis or foraminal narrowing. Mild facet degenerative changes. At L2-L3, mild disc space narrowing. No canal stenosis. No foraminal narrowing. Mild facet degenerative changes. At L3-L4, no canal stenosis. Mild facet degenerative changes. No foraminal narrowing. At L4-L5, mild diffuse disc bulge. No high-grade canal stenosis. Mild facet degenerative changes. No significant foraminal narrowing. At L5-S1, mild disc space narrowing. Mild disc bulge. No canal stenosis. Mild facet degenerative changes. No foraminal narrowing IMPRESSION: 1. No CT evidence for acute osseous abnormality. 2. Mild chronic compression deformity of L1. 3. Mild degenerative changes. Electronically Signed   By: Esmeralda Hedge M.D.   On: 11/06/2023 02:29    Procedures Procedures    Medications Ordered in  ED Medications  oxyCODONE-acetaminophen (PERCOCET/ROXICET) 5-325 MG per tablet 2 tablet (2 tablets Oral Given 11/06/23 0106)    ED Course/ Medical Decision Making/ A&P                                 Medical Decision Making Amount and/or Complexity of Data Reviewed Radiology: ordered.  Risk Prescription drug management.  Will just go straight to CT scan to get a better look at her lumbar spine and pelvis and coccyx.  Preemptively treated with medications.  No urinary or GI symptoms to suggest need for contrast or abdominal studies.   Imaging without acute issues. Treated symptomatically here and feels better. Ambualtes. Stable for dc.   Final Clinical Impression(s) / ED Diagnoses Final diagnoses:  Fall, initial encounter  Pain in pelvis    Rx / DC Orders ED Discharge Orders          Ordered    oxyCODONE-acetaminophen (PERCOCET) 5-325 MG tablet  Every 8 hours PRN        11/06/23 0243              Artem Bunte, Reymundo Caulk, MD 11/07/23 0214

## 2023-11-06 NOTE — ED Notes (Signed)
 Pt up[set that she is in the hallway Explained to pt that she does not require cardiac monitor at this time, and will have to remain in the halway. Pt states she needs to talk to a DR. Now.  Explained that will make rounds to talk to her. Her RN made aware

## 2023-11-27 ENCOUNTER — Other Ambulatory Visit: Payer: Self-pay | Admitting: Behavioral Health

## 2023-11-27 DIAGNOSIS — F411 Generalized anxiety disorder: Secondary | ICD-10-CM

## 2023-11-27 DIAGNOSIS — F331 Major depressive disorder, recurrent, moderate: Secondary | ICD-10-CM

## 2023-11-27 NOTE — Telephone Encounter (Signed)
 Pt called at 10:36a requesting refill of Alprazolam  to  [id number] - [place], [place] - [address] AT [facility] [address] [place], [place] [address] Phone: [phone]  Fax: [phone]   She also asked Polly Brink to call her even though I told her he has pts all day.  Next appt 5/16

## 2023-11-27 NOTE — Telephone Encounter (Signed)
 Called patient. She was just wanting her RF, which has already been sent in for fill tomorrow. She has FU 5/16.

## 2023-12-11 ENCOUNTER — Encounter: Payer: Self-pay | Admitting: Behavioral Health

## 2023-12-11 ENCOUNTER — Ambulatory Visit: Payer: Medicare HMO | Admitting: Behavioral Health

## 2023-12-11 DIAGNOSIS — F331 Major depressive disorder, recurrent, moderate: Secondary | ICD-10-CM

## 2023-12-11 DIAGNOSIS — F4310 Post-traumatic stress disorder, unspecified: Secondary | ICD-10-CM

## 2023-12-11 DIAGNOSIS — F5105 Insomnia due to other mental disorder: Secondary | ICD-10-CM | POA: Diagnosis not present

## 2023-12-11 DIAGNOSIS — F99 Mental disorder, not otherwise specified: Secondary | ICD-10-CM

## 2023-12-11 DIAGNOSIS — F411 Generalized anxiety disorder: Secondary | ICD-10-CM

## 2023-12-11 MED ORDER — BUPROPION HCL ER (XL) 300 MG PO TB24: 300.0000 mg | ORAL_TABLET | Freq: Every day | ORAL | 1 refills | Status: DC

## 2023-12-11 MED ORDER — BUSPIRONE HCL 30 MG PO TABS: 30.0000 mg | ORAL_TABLET | Freq: Two times a day (BID) | ORAL | 1 refills | Status: DC

## 2023-12-11 MED ORDER — PRAZOSIN HCL 2 MG PO CAPS: 8.0000 mg | ORAL_CAPSULE | Freq: Every day | ORAL | 1 refills | Status: DC

## 2023-12-11 MED ORDER — TRAZODONE HCL 100 MG PO TABS: ORAL_TABLET | ORAL | 1 refills | Status: DC

## 2023-12-11 MED ORDER — ALPRAZOLAM 1 MG PO TABS
ORAL_TABLET | ORAL | 5 refills | Status: AC
Start: 2023-12-11 — End: ?

## 2023-12-11 MED ORDER — BUPROPION HCL ER (XL) 150 MG PO TB24
ORAL_TABLET | ORAL | 1 refills | Status: DC
Start: 2023-12-11 — End: 2024-04-13

## 2023-12-11 NOTE — Progress Notes (Signed)
 [facility]  Patient ID: Jenna Zamora,  MRN: [id number]  PCP: Patient, No Pcp Per  Date of Evaluation: 12/11/2023 Time spent:30 minutes  Chief Complaint:  Chief Complaint   Depression; Anxiety; Family Problem; Follow-up; Patient Education; Medication Refill; Stress     HISTORY/CURRENT STATUS: HPI 53  year old patient presents to this office for follow up and medication management. She continues to feel better overall but she has so much circumstantial family trauma occurring with her children continues. Her surviving daughter is doing well right now. Went through rehab and IOP in [place] . Taking parenting classed. Hopes she can take custody back of her grandchildren eventually.  Says her AD is helping her process but feels like she needs medication increase this visit. She reports her anxiety today at 2/10 and depression at  2/10.  She is sleeping 6-7 hours per night with aid of medication.  No No mania present. No psychosis. No SI/HI.   Past medication failures: Abilify Zoloft   Individual Medical History/ Review of Systems: Changes? :No   Allergies: Morphine and codeine, Codeine, and Tramadol  Current Medications:  Current Outpatient Medications:    acetaminophen  (TYLENOL ) 500 MG tablet, Take 500 mg by mouth every 6 (six) hours as needed for mild pain. (Patient not taking: Reported on 01/10/2021), Disp: , Rfl:    albuterol  (VENTOLIN  HFA) 108 (90 Base) MCG/ACT inhaler, Inhale 1 puff into the lungs every 4 (four) hours., Disp: , Rfl:    ALPRAZolam  (XANAX ) 1 MG tablet, TAKE 1/2 TO 1 TABLET BY MOUTH 3 TIMES A DAY AS NEEDED FOR ANXIETY, Disp: 90 tablet, Rfl: 5   azithromycin (ZITHROMAX) 250 MG tablet, Take by mouth., Disp: , Rfl:    buPROPion  (WELLBUTRIN  XL) 150 MG 24 hr tablet, Take one tablet by mouth daily., Disp: 90 tablet, Rfl: 1   buPROPion  (WELLBUTRIN  XL) 300 MG 24 hr tablet, Take 1 tablet (300 mg total) by mouth daily., Disp: 90 tablet, Rfl: 1   busPIRone   (BUSPAR ) 30 MG tablet, Take 1 tablet (30 mg total) by mouth 2 (two) times daily., Disp: 180 tablet, Rfl: 1   cyclobenzaprine  (FLEXERIL ) 10 MG tablet, Take 1 tablet (10 mg total) by mouth 2 (two) times daily as needed for muscle spasms. (Patient not taking: Reported on 01/10/2021), Disp: 20 tablet, Rfl: 0   doxycycline  (VIBRAMYCIN ) 100 MG capsule, Take 1 capsule (100 mg total) by mouth 2 (two) times daily. (Patient not taking: Reported on 01/10/2021), Disp: 20 capsule, Rfl: 0   ibuprofen  (ADVIL ,MOTRIN ) 600 MG tablet, Take 1 tablet (600 mg total) by mouth every 6 (six) hours as needed. (Patient taking differently: Take 600 mg by mouth every 6 (six) hours as needed for moderate pain.), Disp: 30 tablet, Rfl: 0   ondansetron  (ZOFRAN ) 4 MG tablet, Take 1 tablet (4 mg total) by mouth every 6 (six) hours., Disp: 12 tablet, Rfl: 0   oxyCODONE -acetaminophen  (PERCOCET) 5-325 MG tablet, Take 1 tablet by mouth every 8 (eight) hours as needed for severe pain (pain score 7-10)., Disp: 10 tablet, Rfl: 0   potassium chloride  SA (K-DUR,KLOR-CON ) 20 MEQ tablet, Take 1 tablet (20 mEq total) by mouth 2 (two) times daily., Disp: 10 tablet, Rfl: 0   prazosin  (MINIPRESS ) 2 MG capsule, Take 4 capsules (8 mg total) by mouth at bedtime., Disp: 360 capsule, Rfl: 1   predniSONE  (DELTASONE ) 20 MG tablet, 3 po daily for 2 days, then 2 po daily for 2 days, then 1 po daily for 2 days, Disp: ,  Rfl:    rosuvastatin  (CRESTOR ) 20 MG tablet, TAKE 1 TABLET BY MOUTH EVERY DAY, Disp: 90 tablet, Rfl: 1   rosuvastatin  (CRESTOR ) 5 MG tablet, Take 5 mg by mouth daily., Disp: , Rfl:    traZODone  (DESYREL ) 100 MG tablet, Take two tablets by mouth at bedtime daily for sleep and anxiety, Disp: 180 tablet, Rfl: 1 Medication Side Effects: none  Family Medical/ Social History: Changes? No  MENTAL HEALTH EXAM:  There were no vitals taken for this visit.There is no height or weight on file to calculate BMI.  General Appearance: Casual, Neat, and Well  Groomed  Eye Contact:  Good  Speech:  Clear and Coherent  Volume:  Normal  Mood:  NA  Affect:  Appropriate  Thought Process:  Coherent  Orientation:  Full (Time, Place, and Person)  Thought Content: Logical   Suicidal Thoughts:  No  Homicidal Thoughts:  No  Memory:  WNL  Judgement:  Good  Insight:  Good  Psychomotor Activity:  Normal  Concentration:  Concentration: Good  Recall:  Good  Fund of Knowledge: Good  Language: Fair  Assets:  Desire for Improvement  ADL's:  Intact  Cognition: WNL  Prognosis:  Good    DIAGNOSES:    ICD-10-CM   1. Major depressive disorder, recurrent episode, moderate (HCC)  F33.1 busPIRone  (BUSPAR ) 30 MG tablet    buPROPion  (WELLBUTRIN  XL) 150 MG 24 hr tablet    buPROPion  (WELLBUTRIN  XL) 300 MG 24 hr tablet    traZODone  (DESYREL ) 100 MG tablet    2. Generalized anxiety disorder  F41.1 busPIRone  (BUSPAR ) 30 MG tablet    ALPRAZolam  (XANAX ) 1 MG tablet    buPROPion  (WELLBUTRIN  XL) 150 MG 24 hr tablet    buPROPion  (WELLBUTRIN  XL) 300 MG 24 hr tablet    traZODone  (DESYREL ) 100 MG tablet    3. PTSD (post-traumatic stress disorder)  F43.10 prazosin  (MINIPRESS ) 2 MG capsule    4. Insomnia due to other mental disorder  F51.05 traZODone  (DESYREL ) 100 MG tablet   F99       Receiving Psychotherapy: No    RECOMMENDATIONS:   Greater than 50% of 30 min. of face to face time with patient was spent on counseling and coordination of care. We discussed her progress with grief. Talked about her continue traumatic family dynamics. Oldest daughter Ennis Hart  is doing better. Multiple Family members involved in substance abuse.  She has custody of grandchildren.  She has been consistent with appointments.   Continue Xanax  0.5-1 mg tablet three times daily prn.  This is temporary while she is dealing with increased anxiety from death of her daughter.  Continue Trazodone  200 mg at bedtime  Continue prazosin  to 8 mg at bedtime. Continue Buspar  30 mg twice  daily Continue Wellbutrin   to 450  mg XL daily Will report and side effects or worsening symptoms promptly. Will follow up in 3 months to reassess Provided emergency contact information Recommended psychotherapy but patient says that she is not in the position to receive Discussed potential benefits, risk, and side effects of benzodiazepines to include potential risk of tolerance and dependence, as well as possible drowsiness.  Advised patient not to drive if experiencing drowsiness and to take lowest possible effective dose to minimize risk of dependence and tolerance. Reviewed PDMP    Lincoln Renshaw, NP

## 2023-12-23 NOTE — Progress Notes (Signed)
 Cardiology Office Note    Date:  12/27/2023  ID:  Jenna Zamora, DOB [birth year], MRN [id number] PCP:  [name], MD  Cardiologist:  [name], MD  Electrophysiologist:  None   Chief Complaint: Shortness of breath   History of Present Illness: .    Jenna Zamora is a 53 y.o. female with visit-pertinent history of hypertension, hyperlipidemia, tobacco use, COPD, rheumatoid arthritis.   Patient first seen by Dr. Alda Zamora on 12/26/2020 for evaluation of hyperlipidemia.  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  On 09/11/2020 her LDL cholesterol was 123.  Patient reported she started smoking when she was 19 and smoked 1 pack/day at [facility].  Patient reported office visit that the prior year she been having chest pain and shortness of breath.  She reported a central chest pain and shortness of breath on exertion and at rest.  She had reported this would occur a few times a month, usually associate with stress.  Pain was localized in the left left and was stabbing in quality, duration was typically 30 seconds.  Chest pain was atypical instruction but given CAD risk factors such as tobacco use, family history and hyperlipidemia a treadmill Myoview  was recommended.  Patient also reported syncopal events, 2 weeks Zio patch was recommended.  Cardiac monitor in 12/2020 indicated an average heart rate of 89 bpm ranging 58 247 bpm.  Predominant underlying rhythm was sinus rhythm, there was 1 run of supraventricular tachycardia lasting 8 beats with a max rate of 107 bpm.  CT cardiac scoring indicated a coronary calcium  score of 0, this was 1st percentile for age, gender and race matched controls.  Treadmill MPI on 01/23/2021 indicated LVEF of 55 to 65%, no ST segment deviation was noted during stress, no T wave inversion was noted during stress, study was read as normal and low risk.  Today she presents at the recommendation of her PCP.   The patient reports that she has been doing well overall, she reports that a few months ago she was started on antihypertensive medications.  She denies any chest pain, notes she has some baseline shortness of breath related to her history of COPD and tobacco use, denies any significant changes.  She does note some increased lower extremity edema, she denies any orthopnea or PND.  She also reports that she has rheumatoid arthritis and can have some swelling with her flares however notes this increased lower extremity edema seems to be different.  Patient reports that she quit smoking a few weeks ago, notes difficulty with this she notes she has anxiety.  She denies any regular exercise, reports that she helps take care of her 4 grandchildren and tolerates this overall well.  ROS: .   Today she denies fatigue, palpitations, melena, hematuria, hemoptysis, diaphoresis, weakness, presyncope, syncope, orthopnea, and PND.  All other systems are reviewed and otherwise negative. Studies Reviewed: Jenna Zamora   EKG:  EKG is ordered today, personally reviewed, demonstrating  EKG Interpretation Date/Time:  Friday Dec 25 2023 11:07:23 EDT Ventricular Rate:  70 PR Interval:  132 QRS Duration:  96 QT Interval:  414 QTC Calculation: 447 R Axis:   55  Text Interpretation: Normal sinus rhythm Normal ECG When compared with ECG of 28-Sep-2019 20:36, No significant change was found Confirmed by Jenna Zamora [phone]) on 12/25/2023 11:15:08 AM   CV Studies: Cardiac studies reviewed are outlined and summarized above.  Otherwise please see EMR for full report. Cardiac Studies & Procedures   ______________________________________________________________________________________________   STRESS TESTS  MYOCARDIAL PERFUSION IMAGING 01/23/2021  Narrative  The left ventricular ejection fraction is normal (55-65%).  Nuclear stress EF: 57%.  There was no ST segment deviation noted during stress.  No T wave inversion was noted  during stress.  The study is normal.  This is a low risk study.  1.  Normal study without evidence of ischemia or infarction. 2.  Normal LVEF, 57%. 3.  Average functional capacity (5:30 min:s; 7 METS). 4.  Normal heart rate and blood pressure response to exercise. 5.  The patient developed an SVT in recovery with heart rate up to 180 bpm.  This self terminated in recovery. 6.  This is a low risk study.      MONITORS  LONG TERM MONITOR (3-14 DAYS) 01/21/2021  Narrative  No significant arrhythmias   Patch Wear Time:  14 days and 0 hours (2022-06-07T11:32:04-399 to 2022-06-21T11:32:14-0400)  Patient had a min HR of 58 bpm, max HR of 147 bpm, and avg HR of 89 bpm. Predominant underlying rhythm was Sinus Rhythm. Slight P wave morphology changes were noted. 1 run of Supraventricular Tachycardia occurred lasting 8 beats with a max rate of 107 bpm (avg 102 bpm). Isolated SVEs were rare (<1.0%), SVE Couplets were rare (<1.0%), and SVE Triplets were rare (<1.0%). Isolated VEs were rare (<1.0%), VE Couplets were rare (<1.0%), and no VE Triplets were present. 27 patient triggered events corresponding to sinus rhythm +/- PACs/PVCs   CT SCANS  CT CARDIAC SCORING (SELF PAY ONLY) 01/23/2021  Addendum 01/23/2021  1:26 PM ADDENDUM REPORT: 01/23/2021 13:24  CLINICAL DATA:  Risk stratification: 53 Year-old White Female  EXAM: Coronary Calcium  Score  TECHNIQUE: The patient was scanned on a [employer]. Axial non-contrast 3 mm slices were carried out through the heart. The data set was analyzed on a dedicated work station and scored using the Agatson method.  FINDINGS: Non-cardiac: See separate report from [facility].  Ascending Aorta: Normal caliber.  Pericardium: Normal.  Coronary arteries: Normal origins.  Coronary Calcium  Score:  Left main: 0  Left anterior descending artery: 0  Left circumflex artery: 0  Right coronary artery: 0  Total:  0  Percentile: 1st for age, sex, and race matched control.  IMPRESSION: 1. Coronary calcium  score of 0. This was 1st percentile for age, gender, and race matched controls.  RECOMMENDATIONS:  Coronary artery calcium  (CAC) score is a strong predictor of incident coronary heart disease (CHD) and provides predictive information beyond traditional risk factors. CAC scoring is reasonable to use in the decision to withhold, postpone, or initiate statin therapy in intermediate-risk or selected borderline-risk asymptomatic adults (age 38-75 years and LDL-C >=70 to <190 mg/dL) who do not have diabetes or established atherosclerotic cardiovascular disease (ASCVD).* In intermediate-risk (10-year ASCVD risk >=7.5% to <20%) adults or selected borderline-risk (10-year ASCVD risk >=5% to <7.5%) adults in whom a CAC score is measured for the purpose of making a treatment decision the following recommendations have been made:  If CAC = 0, it is reasonable to withhold statin therapy and reassess in 5 to 10 years, as long as higher risk conditions are absent (diabetes mellitus, family history of premature CHD in first degree relatives (males <55 years; females <65 years), cigarette smoking, LDL >=190 mg/dL or other independent risk factors).  If CAC is 1 to 99, it is reasonable to initiate statin therapy for patients ?53 years of  age.  If CAC is >=100 or >=75th percentile, it is reasonable to initiate statin therapy at any age.  Cardiology referral should be considered for patients with CAC scores =400 or >=75th percentile.  *2018 AHA/ACC/AACVPR/AAPA/ABC/ACPM/ADA/AGS/APhA/ASPC/NLA/PCNA Guideline on the Management of Blood Cholesterol: A Report of the American College of Cardiology/American Heart Association Task Force on Clinical Practice Guidelines. J Am Coll Cardiol. 2019;73(24):3168-3209.  Gloriann Larger, MD   Electronically Signed By: Gloriann Larger MD On: 01/23/2021  13:24  Narrative EXAM: OVER-READ INTERPRETATION  CT CHEST  The following report is an over-read performed by radiologist Dr. Elene Griffes of [facility] on 01/23/2021. This over-read does not include interpretation of cardiac or coronary anatomy or pathology. The coronary calcium  score/coronary CTA interpretation by the cardiologist is attached.  COMPARISON:  Chest CT 12/12/2016  FINDINGS: Vascular: Normal caliber of the visualized thoracic aorta. Heart size is normal.  Mediastinum/Nodes: Normal appearance.  Lungs/Pleura: Several small nodules throughout the visualized lungs are stable since 2018. Index nodule at the left lung base on sequence 3 image 39 measures 4 mm and stable. No significant pleural fluid. Lung parenchyma has areas of mosaic attenuation that could be related to air trapping or post inflammatory changes.  Upper Abdomen: Normal  Musculoskeletal: No acute bone abnormality.  IMPRESSION: 1. No acute abnormality in the extracardiac structures. 2. Stable small bilateral pulmonary nodules. These are compatible with benign nodules based on the stability.  Electronically Signed: By: Elene Griffes M.D. On: 01/23/2021 12:32     ______________________________________________________________________________________________       Current Reported Medications:.    Current Meds  Medication Sig   acetaminophen  (TYLENOL ) 500 MG tablet Take 500 mg by mouth every 6 (six) hours as needed for mild pain (pain score 1-3).   albuterol  (VENTOLIN  HFA) 108 (90 Base) MCG/ACT inhaler Inhale 1 puff into the lungs every 4 (four) hours.   ALPRAZolam  (XANAX ) 1 MG tablet TAKE 1/2 TO 1 TABLET BY MOUTH 3 TIMES A DAY AS NEEDED FOR ANXIETY   buPROPion  (WELLBUTRIN  XL) 150 MG 24 hr tablet Take one tablet by mouth daily.   buPROPion  (WELLBUTRIN  XL) 300 MG 24 hr tablet Take 1 tablet (300 mg total) by mouth daily.   busPIRone  (BUSPAR ) 30 MG tablet Take 1 tablet (30 mg total) by  mouth 2 (two) times daily.   hydrochlorothiazide (HYDRODIURIL) 12.5 MG tablet Take 12.5 mg by mouth daily.   ibuprofen  (ADVIL ,MOTRIN ) 600 MG tablet Take 1 tablet (600 mg total) by mouth every 6 (six) hours as needed. (Patient taking differently: Take 600 mg by mouth every 6 (six) hours as needed for moderate pain (pain score 4-6).)   losartan (COZAAR) 50 MG tablet Take 50 mg by mouth daily.   potassium chloride  SA (K-DUR,KLOR-CON ) 20 MEQ tablet Take 1 tablet (20 mEq total) by mouth 2 (two) times daily.   prazosin  (MINIPRESS ) 2 MG capsule Take 4 capsules (8 mg total) by mouth at bedtime.   rosuvastatin  (CRESTOR ) 20 MG tablet TAKE 1 TABLET BY MOUTH EVERY DAY   tiZANidine (ZANAFLEX) 4 MG tablet Take 4 mg by mouth 3 (three) times daily as needed.   traZODone  (DESYREL ) 100 MG tablet Take two tablets by mouth at bedtime daily for sleep and anxiety    Physical Exam:    VS:  BP 112/70 (BP Location: Left Arm, Patient Position: Sitting, Cuff Size: Normal)   Pulse 70   Ht 5\' 2"  (1.575 m)   Wt 148 lb (67.1 kg)   SpO2 95%  BMI 27.07 kg/m    Wt Readings from Last 3 Encounters:  12/25/23 148 lb (67.1 kg)  03/25/21 144 lb (65.3 kg)  01/23/21 139 lb (63 kg)    GEN: Well nourished, well developed in no acute distress NECK: No JVD; No carotid bruits CARDIAC: RRR, no murmurs, rubs, gallops RESPIRATORY:  Clear to auscultation without rales, wheezing or rhonchi  ABDOMEN: Soft, non-tender, non-distended EXTREMITIES:  No edema; No acute deformity     Asessement and Plan:.    Chest pain/shortness of breath/lower extremity edema: CT cardiac scoring in 2022 indicated a coronary calcium  score of 0, this was 1st percentile for age, gender and race matched controls. Treadmill MPI on 01/23/2021 indicated LVEF of 55 to 65%, no ST segment deviation was noted during stress, no T wave inversion was noted during stress, study was read as normal and low risk.  Today patient denies any chest pain, or discomfort.  She  does note that when she has anxiety attacks she will get some slight tightness that resolves when she calms, notes this has been present for years.  She denies any chest pain with any form of exertion.  She will continue to monitor for symptoms.  She does note some increased shortness of breath in recent years that she feels is stable and related to her COPD.  She also notes increased lower extremity edema, that she feels may not be related to her RA.  Will check echocardiogram.  Reviewed ED precautions.  Syncope: Patient reports that she has not had any further syncopal episodes in years.  Hypertension: Blood pressure today 112/70.  Continue current antihypertensive regimen.  Hyperlipidemia: No recent lipid profile available for review, patient reports that she is not following at the PCP who is managing.  Continue Crestor  20 mg daily.  Tobacco use/COPD: Patient reports that in recent years she has had some baseline shortness of breath, she feels related to COPD.  Will check echocardiogram given reports of increased lower extremity edema, none appreciated on exam today, she denies any orthopnea or PND.  She reports that she stopped smoking a few weeks ago, congratulated.  Complete cessation encouraged.    Disposition: F/u with Nianna Igo, NP in three months.   Signed, Kaire Stary D Zeynep Fantroy, NP

## 2023-12-25 ENCOUNTER — Encounter: Payer: Self-pay | Admitting: Cardiology

## 2023-12-25 ENCOUNTER — Ambulatory Visit: Attending: Cardiology | Admitting: Cardiology

## 2023-12-25 VITALS — BP 112/70 | HR 70 | Ht 62.0 in | Wt 148.0 lb

## 2023-12-25 DIAGNOSIS — Z720 Tobacco use: Secondary | ICD-10-CM

## 2023-12-25 DIAGNOSIS — I10 Essential (primary) hypertension: Secondary | ICD-10-CM

## 2023-12-25 DIAGNOSIS — J449 Chronic obstructive pulmonary disease, unspecified: Secondary | ICD-10-CM

## 2023-12-25 DIAGNOSIS — E785 Hyperlipidemia, unspecified: Secondary | ICD-10-CM | POA: Diagnosis not present

## 2023-12-25 DIAGNOSIS — Z136 Encounter for screening for cardiovascular disorders: Secondary | ICD-10-CM

## 2023-12-25 DIAGNOSIS — R600 Localized edema: Secondary | ICD-10-CM

## 2023-12-25 DIAGNOSIS — R0602 Shortness of breath: Secondary | ICD-10-CM

## 2023-12-25 NOTE — Patient Instructions (Signed)
 Medication Instructions:  No changes *If you need a refill on your cardiac medications before your next appointment, please call your pharmacy*  Lab Work: No labs If you have labs (blood work) drawn today and your tests are completely normal, you will receive your results only by: MyChart Message (if you have MyChart) OR A paper copy in the mail If you have any lab test that is abnormal or we need to change your treatment, we will call you to review the results.  Testing/Procedures: Your physician has requested that you have an echocardiogram. Echocardiography is a painless test that uses sound waves to create images of your heart. It provides your doctor with information about the size and shape of your heart and how well your heart's chambers and valves are working. This procedure takes approximately one hour. There are no restrictions for this procedure. Please do NOT wear cologne, perfume, aftershave, or lotions (deodorant is allowed). Please arrive 15 minutes prior to your appointment time.  Please note: We ask at that you not bring children with you during ultrasound (echo/ vascular) testing. Due to room size and safety concerns, children are not allowed in the ultrasound rooms during exams. Our front office staff cannot provide observation of children in our lobby area while testing is being conducted. An adult accompanying a patient to their appointment will only be allowed in the ultrasound room at the discretion of the ultrasound technician under special circumstances. We apologize for any inconvenience.  Follow-Up: At [facility], you and your health needs are our priority.  As part of our continuing mission to provide you with exceptional heart care, our providers are all part of one team.  This team includes your primary Cardiologist (physician) and Advanced Practice Providers or APPs (Physician Assistants and Nurse Practitioners) who all work together to provide you with  the care you need, when you need it.  Your next appointment:   3 month(s)  Provider:   Katlyn West, NP Then, [name], MD will plan to see you again in 1 year(s).    We recommend signing up for the patient portal called "MyChart".  Sign up information is provided on this After Visit Summary.  MyChart is used to connect with patients for Virtual Visits (Telemedicine).  Patients are able to view lab/test results, encounter notes, upcoming appointments, etc.  Non-urgent messages can be sent to your provider as well.   To learn more about what you can do with MyChart, go to [email or web address].

## 2023-12-27 ENCOUNTER — Encounter: Payer: Self-pay | Admitting: Cardiology

## 2024-01-22 ENCOUNTER — Other Ambulatory Visit: Payer: Self-pay | Admitting: Behavioral Health

## 2024-01-22 DIAGNOSIS — F331 Major depressive disorder, recurrent, moderate: Secondary | ICD-10-CM

## 2024-01-22 DIAGNOSIS — E7800 Pure hypercholesterolemia, unspecified: Secondary | ICD-10-CM

## 2024-01-22 DIAGNOSIS — F411 Generalized anxiety disorder: Secondary | ICD-10-CM

## 2024-02-10 ENCOUNTER — Encounter (HOSPITAL_COMMUNITY): Payer: Self-pay | Admitting: Cardiology

## 2024-02-10 ENCOUNTER — Ambulatory Visit (HOSPITAL_COMMUNITY): Attending: Cardiovascular Disease

## 2024-02-18 ENCOUNTER — Telehealth: Payer: Self-pay | Admitting: *Deleted

## 2024-02-18 NOTE — Telephone Encounter (Signed)
 A message was left re: rescheduling her test.

## 2024-02-24 ENCOUNTER — Other Ambulatory Visit: Payer: Self-pay

## 2024-02-24 ENCOUNTER — Telehealth: Payer: Self-pay | Admitting: Behavioral Health

## 2024-02-24 NOTE — Telephone Encounter (Signed)
 Explained to patient what the issue was - she is on oxycodone  and alprazolam . Received fax from pharmacy and it was given to [facility]. She would like a call back when issue has been resolved.

## 2024-02-24 NOTE — Telephone Encounter (Signed)
 Pt lvm @ 3:13p stating that the pain med doctor also put her on Gabapentin 100mg  3 times a day and Flexiril 5mg  1 time a day (which she didn't mention in previous call today).  She would like a call back to know what she can take and what she can't.

## 2024-02-24 NOTE — Telephone Encounter (Signed)
 Pt is taking gabapentin, Flexeril , and oxycodone  from her pain doctor. I have imported these into Epic.  The insurance company won't approve alprazolam  because of the oxycodone . I gave fax to [facility] about this yesterday. Please advise if there are any contraindications with medications.

## 2024-02-24 NOTE — Telephone Encounter (Signed)
 The problem is all of these are cns depressants which can effect breathing. She definitely needs narcan inside her home. Im not comfortable continuing xanax  with this regimen.  Gabapentin  can help with anxiety as well as flexeril  being calming. I would at minimum reduce xanax  down to 1 mg per day prn.  She needs to understand that this is for her safety

## 2024-02-24 NOTE — Telephone Encounter (Signed)
 Pt called at 1:05p stating she has seen a pain management doctor who is starting her on Oxycodone .  She wanted to talk to Redell about it since she takes Alprazolam .  Next appt 11/17

## 2024-02-25 NOTE — Telephone Encounter (Signed)
 LVM to [facility]

## 2024-02-25 NOTE — Telephone Encounter (Signed)
 Please call to get her an earlier appt with Redell. Needs next available please.

## 2024-03-07 ENCOUNTER — Other Ambulatory Visit: Payer: Self-pay

## 2024-03-07 ENCOUNTER — Other Ambulatory Visit: Payer: Self-pay | Admitting: Behavioral Health

## 2024-03-07 DIAGNOSIS — E7800 Pure hypercholesterolemia, unspecified: Secondary | ICD-10-CM

## 2024-03-08 ENCOUNTER — Telehealth: Payer: Self-pay

## 2024-03-08 NOTE — Telephone Encounter (Signed)
 PA approved for Alprazolam  1 mg #90/30 day with [employer] through 07/27/24. PA# [id number]

## 2024-03-08 NOTE — Telephone Encounter (Signed)
 Contacted [employer] regarding pt's Rx for Alprazolam  1 mg, due to drug to drug interaction with Oxycodone . PA submitted to justify benefits outweigh risks. Another provider prescribes Oxycodone .  Pending response

## 2024-03-21 ENCOUNTER — Ambulatory Visit: Admitting: Cardiology

## 2024-03-30 ENCOUNTER — Ambulatory Visit: Admitting: Behavioral Health

## 2024-04-13 ENCOUNTER — Encounter: Payer: Self-pay | Admitting: Behavioral Health

## 2024-04-13 ENCOUNTER — Other Ambulatory Visit: Payer: Self-pay

## 2024-04-13 ENCOUNTER — Ambulatory Visit (HOSPITAL_COMMUNITY)
Admission: RE | Admit: 2024-04-13 | Discharge: 2024-04-13 | Disposition: A | Discharge status: Home / Self Care | Source: Ambulatory Visit | Attending: Cardiovascular Disease | Admitting: Cardiovascular Disease

## 2024-04-13 ENCOUNTER — Ambulatory Visit (INDEPENDENT_AMBULATORY_CARE_PROVIDER_SITE_OTHER): Admitting: Behavioral Health

## 2024-04-13 DIAGNOSIS — F411 Generalized anxiety disorder: Secondary | ICD-10-CM

## 2024-04-13 DIAGNOSIS — F4310 Post-traumatic stress disorder, unspecified: Secondary | ICD-10-CM

## 2024-04-13 DIAGNOSIS — R600 Localized edema: Secondary | ICD-10-CM | POA: Insufficient documentation

## 2024-04-13 DIAGNOSIS — F331 Major depressive disorder, recurrent, moderate: Secondary | ICD-10-CM | POA: Diagnosis not present

## 2024-04-13 DIAGNOSIS — F5105 Insomnia due to other mental disorder: Secondary | ICD-10-CM

## 2024-04-13 DIAGNOSIS — R0602 Shortness of breath: Secondary | ICD-10-CM | POA: Diagnosis present

## 2024-04-13 DIAGNOSIS — F99 Mental disorder, not otherwise specified: Secondary | ICD-10-CM

## 2024-04-13 LAB — ECHOCARDIOGRAM COMPLETE
AR max vel: 2.09 cm2
AV Area VTI: 2.04 cm2
AV Area mean vel: 1.97 cm2
AV Mean grad: 3 mmHg
AV Peak grad: 5.1 mmHg
Ao pk vel: 1.13 m/s
Area-P 1/2: 4.41 cm2
Est EF: 55
S' Lateral: 3.1 cm

## 2024-04-13 MED ORDER — PRAZOSIN HCL 2 MG PO CAPS: 8.0000 mg | ORAL_CAPSULE | Freq: Every day | ORAL | 1 refills | Status: AC

## 2024-04-13 MED ORDER — TRAZODONE HCL 100 MG PO TABS: ORAL_TABLET | ORAL | 1 refills | Status: AC

## 2024-04-13 MED ORDER — BUSPIRONE HCL 30 MG PO TABS: 30.0000 mg | ORAL_TABLET | Freq: Two times a day (BID) | ORAL | 1 refills | Status: AC

## 2024-04-13 MED ORDER — BUPROPION HCL ER (XL) 300 MG PO TB24: 300.0000 mg | ORAL_TABLET | Freq: Every day | ORAL | 1 refills | Status: AC

## 2024-04-13 MED ORDER — BUPROPION HCL ER (XL) 150 MG PO TB24: ORAL_TABLET | ORAL | 1 refills | Status: AC

## 2024-04-13 MED ORDER — QUETIAPINE FUMARATE 50 MG PO TABS: 50.0000 mg | ORAL_TABLET | Freq: Every day | ORAL | 1 refills | Status: DC

## 2024-04-13 NOTE — Progress Notes (Signed)
 [facility]  Patient ID: Jenna Zamora,  MRN: [id number]  PCP: Orpha Yancey LABOR, MD  Date of Evaluation: 04/13/2024 Time spent:30 minutes  Chief Complaint:  Chief Complaint   Family Problem; Medication Refill; Medication Problem; Patient Education; Follow-up; Anxiety; Depression; Stress     HISTORY/CURRENT STATUS: HPI 53  year old patient presents to this office for follow up and medication management.  Patient reports that she continues to be under high levels of stress due to family trauma, and developing personal health issues.  She is very concerned that she may have some abdominal cancer that has returned.  She will be going for further evaluation. Continues to experience  circumstantial family trauma with her surviving daughter.  Her daughter was recently charged with possession of heroin.  She has already lost 1 daughter to drug overdose.  Her surviving daughter is doing well right now.  She is currently under the care of pain management who is taking over the management of her benzodiazepine.  For now she does not want to change any of her psychiatric medications.  She reports her anxiety today at 5/10 and depression at  4/10.  She is sleeping 6-7 hours per night with aid of medication.  No No mania present. No psychosis. No SI/HI.   Past medication failures: Abilify Zoloft    Individual Medical History/ Review of Systems: Changes? :No   Allergies: Morphine and codeine, Codeine, and Tramadol  Current Medications:  Current Outpatient Medications:    albuterol  (VENTOLIN  HFA) 108 (90 Base) MCG/ACT inhaler, Inhale 1 puff into the lungs every 4 (four) hours., Disp: , Rfl:    ALPRAZolam  (XANAX ) 1 MG tablet, TAKE 1/2 TO 1 TABLET BY MOUTH 3 TIMES A DAY AS NEEDED FOR ANXIETY, Disp: 90 tablet, Rfl: 5   buPROPion  (WELLBUTRIN  XL) 150 MG 24 hr tablet, Take one tablet by mouth daily., Disp: 90 tablet, Rfl: 1   buPROPion  (WELLBUTRIN  XL) 300 MG 24 hr tablet, Take 1 tablet (300  mg total) by mouth daily., Disp: 90 tablet, Rfl: 1   busPIRone  (BUSPAR ) 30 MG tablet, Take 1 tablet (30 mg total) by mouth 2 (two) times daily., Disp: 180 tablet, Rfl: 1   cyclobenzaprine  (FLEXERIL ) 5 MG tablet, Take 5 mg by mouth at bedtime as needed., Disp: , Rfl:    hydrochlorothiazide (HYDRODIURIL) 12.5 MG tablet, Take 12.5 mg by mouth daily., Disp: , Rfl:    losartan (COZAAR) 50 MG tablet, Take 50 mg by mouth daily., Disp: , Rfl:    potassium chloride  SA (K-DUR,KLOR-CON ) 20 MEQ tablet, Take 1 tablet (20 mEq total) by mouth 2 (two) times daily., Disp: 10 tablet, Rfl: 0   prazosin  (MINIPRESS ) 2 MG capsule, Take 4 capsules (8 mg total) by mouth at bedtime., Disp: 360 capsule, Rfl: 1   rosuvastatin  (CRESTOR ) 20 MG tablet, TAKE 1 TABLET BY MOUTH EVERY DAY, Disp: 90 tablet, Rfl: 1   tiZANidine (ZANAFLEX) 4 MG tablet, Take 4 mg by mouth 3 (three) times daily as needed., Disp: , Rfl:    traZODone  (DESYREL ) 100 MG tablet, Take two tablets by mouth at bedtime daily for sleep and anxiety, Disp: 180 tablet, Rfl: 1 Medication Side Effects: none  Family Medical/ Social History: Changes? No  MENTAL HEALTH EXAM:  There were no vitals taken for this visit.There is no height or weight on file to calculate BMI.  General Appearance: Casual, Neat, and Well Groomed  Eye Contact:  Good  Speech:  Clear and Coherent  Volume:  Normal  Mood:  Anxious, Depressed, and Dysphoric  Affect:  Congruent, Depressed, and Anxious  Thought Process:  Coherent  Orientation:  Full (Time, Place, and Person)  Thought Content: Logical   Suicidal Thoughts:  No  Homicidal Thoughts:  No  Memory:  WNL  Judgement:  Good  Insight:  Good  Psychomotor Activity:  Normal  Concentration:  Concentration: Good  Recall:  Good  Fund of Knowledge: Good  Language: Good  Assets:  Desire for Improvement  ADL's:  Intact  Cognition: WNL  Prognosis:  Good    DIAGNOSES:    ICD-10-CM   1. Generalized anxiety disorder  F41.1 busPIRone   (BUSPAR ) 30 MG tablet    buPROPion  (WELLBUTRIN  XL) 300 MG 24 hr tablet    buPROPion  (WELLBUTRIN  XL) 150 MG 24 hr tablet    traZODone  (DESYREL ) 100 MG tablet    2. Major depressive disorder, recurrent episode, moderate (HCC)  F33.1 busPIRone  (BUSPAR ) 30 MG tablet    buPROPion  (WELLBUTRIN  XL) 300 MG 24 hr tablet    buPROPion  (WELLBUTRIN  XL) 150 MG 24 hr tablet    traZODone  (DESYREL ) 100 MG tablet    3. Insomnia due to other mental disorder  F51.05 traZODone  (DESYREL ) 100 MG tablet   F99     4. PTSD (post-traumatic stress disorder)  F43.10 prazosin  (MINIPRESS ) 2 MG capsule      Receiving Psychotherapy: No    RECOMMENDATIONS:    Greater than 50% of 30 min. of face to face time with patient was spent on counseling and coordination of care.  We discussed her continued high levels of stress due to severe family trauma.  Oldest daughter was recently arrested for heroin possession.  She still has extended grief over the death of her youngest daughter.  She is continuingly taking care of her grandchildren and bears the brunt of the responsibility.  Now facing multiple health issues that have her very concerned.  We discussed her current care with pain management which has now taken over administration of benzodiazepines.     Continue Xanax  0.5-1 mg tablet three times daily prn.  Now administered from pain management.  Continue Trazodone  200 mg at bedtime  Continue prazosin  to 8 mg at bedtime. Continue Buspar  30 mg twice daily Continue Wellbutrin   to 450  mg XL daily Will report and side effects or worsening symptoms promptly. Will follow up in 3 months to reassess Provided emergency contact information Recommended psychotherapy but patient says that she is not in the position to receive Discussed potential benefits, risk, and side effects of benzodiazepines to include potential risk of tolerance and dependence, as well as possible drowsiness.  Advised patient not to drive if experiencing  drowsiness and to take lowest possible effective dose to minimize risk of dependence and tolerance. Reviewed PDMP         Redell DELENA Pizza, NP

## 2024-04-16 ENCOUNTER — Ambulatory Visit: Payer: Self-pay | Admitting: Cardiology

## 2024-04-17 ENCOUNTER — Ambulatory Visit (HOSPITAL_COMMUNITY)
Admission: RE | Admit: 2024-04-17 | Discharge: 2024-04-17 | Disposition: A | Discharge status: Home / Self Care | Source: Ambulatory Visit | Attending: Family | Admitting: Family

## 2024-04-17 ENCOUNTER — Other Ambulatory Visit (HOSPITAL_COMMUNITY): Payer: Self-pay | Admitting: Family

## 2024-04-17 DIAGNOSIS — M542 Cervicalgia: Secondary | ICD-10-CM | POA: Insufficient documentation

## 2024-04-17 DIAGNOSIS — M545 Low back pain, unspecified: Secondary | ICD-10-CM | POA: Insufficient documentation

## 2024-04-18 ENCOUNTER — Encounter: Payer: Self-pay | Admitting: Internal Medicine

## 2024-04-25 ENCOUNTER — Ambulatory Visit: Admitting: Cardiology

## 2024-05-06 ENCOUNTER — Other Ambulatory Visit: Payer: Self-pay | Admitting: Behavioral Health

## 2024-05-08 NOTE — Telephone Encounter (Signed)
Is she still taking?

## 2024-05-11 NOTE — Telephone Encounter (Signed)
 LVM to [facility]

## 2024-05-12 NOTE — Telephone Encounter (Signed)
 Pt said she just filled and doesn't need a RF at this time.

## 2024-05-31 NOTE — Progress Notes (Deleted)
 Cardiology Office Note    Date:  05/31/2024  ID:  Jenna Zamora, DOB Jan 18, 1971, MRN [id number] PCP:  Orpha Yancey LABOR, MD  Cardiologist:  Lonni LITTIE Nanas, MD  Electrophysiologist:  None   Chief Complaint: ***  History of Present Illness: .    Jenna Zamora is a 53 y.o. female with visit-pertinent history of hypertension, hyperlipidemia, tobacco use, COPD, rheumatoid arthritis.    Patient first seen by Dr. Nanas on 12/26/2020 for evaluation of hyperlipidemia.  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  On 09/11/2020 her LDL cholesterol was 123.  Patient reported she started smoking when she was 19 and smoked 1 pack/day at [facility].  Patient reported office visit that the prior year she been having chest pain and shortness of breath.  She reported a central chest pain and shortness of breath on exertion and at rest.  She had reported this would occur a few times a month, usually associate with stress.  Pain was localized in the left left and was stabbing in quality, duration was typically 30 seconds.  Chest pain was atypical instruction but given CAD risk factors such as tobacco use, family history and hyperlipidemia a treadmill Myoview  was recommended.  Patient also reported syncopal events, 2 weeks Zio patch was recommended.   Cardiac monitor in 12/2020 indicated an average heart rate of 89 bpm ranging 58 247 bpm.  Predominant underlying rhythm was sinus rhythm, there was 1 run of supraventricular tachycardia lasting 8 beats with a max rate of 107 bpm.  CT cardiac scoring indicated a coronary calcium  score of 0, this was 1st percentile for age, gender and race matched controls.  Treadmill MPI on 01/23/2021 indicated LVEF of 55 to 65%, no ST segment deviation was noted during stress, no T wave inversion was noted during stress, study was read as normal and low risk.    Labwork independently reviewed:   ROS: .   *** denies  chest pain, shortness of breath, lower extremity edema, fatigue, palpitations, melena, hematuria, hemoptysis, diaphoresis, weakness, presyncope, syncope, orthopnea, and PND.  All other systems are reviewed and otherwise negative.  Studies Reviewed: SABRA    EKG:  EKG is ordered today, personally reviewed, demonstrating ***     CV Studies: Cardiac studies reviewed are outlined and summarized above. Otherwise please see EMR for full report. Cardiac Studies & Procedures   ______________________________________________________________________________________________   STRESS TESTS  MYOCARDIAL PERFUSION IMAGING 01/23/2021  Interpretation Summary  The left ventricular ejection fraction is normal (55-65%).  Nuclear stress EF: 57%.  There was no ST segment deviation noted during stress.  No T wave inversion was noted during stress.  The study is normal.  This is a low risk study.  1.  Normal study without evidence of ischemia or infarction. 2.  Normal LVEF, 57%. 3.  Average functional capacity (5:30 min:s; 7 METS). 4.  Normal heart rate and blood pressure response to exercise. 5.  The patient developed an SVT in recovery with heart rate up to 180 bpm.  This self terminated in recovery. 6.  This is a low risk study.   ECHOCARDIOGRAM  ECHOCARDIOGRAM COMPLETE 04/13/2024  Narrative ECHOCARDIOGRAM REPORT    Patient Name:   Jenna Zamora Date of Exam: 04/13/2024 Medical Rec #:  [id number]       Height:       62.0 in Accession #:    [id number]  Weight:       148.0 lb Date of Birth:  04-19-1971        BSA:          1.682 m Patient Age:    53 years        BP:           109/81 mmHg Patient Gender: F               HR:           70 bpm. Exam Location:  [address]  Procedure: 2D Echo, Cardiac Doppler and Color Doppler (Both Spectral and Color Flow Doppler were utilized during procedure).  Indications:    Shortness of breath R06.02  History:        Patient has prior history of  Echocardiogram examinations. Emphysems, Signs/Symptoms:Edema; Risk Factors:Hyperlipidemia.  Sonographer:    Cherene Ravens RDCS Referring Phys: [id number] [facility] D Patra Gherardi  IMPRESSIONS   1. Left ventricular ejection fraction, by estimation, is 55%. The left ventricle has normal function. The left ventricle has no regional wall motion abnormalities. Left ventricular diastolic parameters are consistent with Grade I diastolic dysfunction (impaired relaxation). 2. Right ventricular systolic function is normal. The right ventricular size is normal. Tricuspid regurgitation signal is inadequate for assessing PA pressure. 3. The mitral valve is normal in structure. Trivial mitral valve regurgitation. No evidence of mitral stenosis. 4. The aortic valve is tricuspid. Aortic valve regurgitation is not visualized. No aortic stenosis is present. 5. The inferior vena cava is normal in size with greater than 50% respiratory variability, suggesting right atrial pressure of 3 mmHg.  FINDINGS Left Ventricle: Left ventricular ejection fraction, by estimation, is 55%. The left ventricle has normal function. The left ventricle has no regional wall motion abnormalities. The left ventricular internal cavity size was normal in size. There is no left ventricular hypertrophy. Left ventricular diastolic parameters are consistent with Grade I diastolic dysfunction (impaired relaxation).  Right Ventricle: The right ventricular size is normal. No increase in right ventricular wall thickness. Right ventricular systolic function is normal. Tricuspid regurgitation signal is inadequate for assessing PA pressure.  Left Atrium: Left atrial size was normal in size.  Right Atrium: Right atrial size was normal in size.  Pericardium: There is no evidence of pericardial effusion.  Mitral Valve: The mitral valve is normal in structure. Trivial mitral valve regurgitation. No evidence of mitral valve stenosis.  Tricuspid Valve: The  tricuspid valve is normal in structure. Tricuspid valve regurgitation is not demonstrated.  Aortic Valve: The aortic valve is tricuspid. Aortic valve regurgitation is not visualized. No aortic stenosis is present. Aortic valve mean gradient measures 3.0 mmHg. Aortic valve peak gradient measures 5.1 mmHg. Aortic valve area, by VTI measures 2.04 cm.  Pulmonic Valve: The pulmonic valve was normal in structure. Pulmonic valve regurgitation is trivial.  Aorta: The aortic root is normal in size and structure.  Venous: The inferior vena cava is normal in size with greater than 50% respiratory variability, suggesting right atrial pressure of 3 mmHg.  IAS/Shunts: No atrial level shunt detected by color flow Doppler.   LEFT VENTRICLE PLAX 2D LVIDd:         4.60 cm   Diastology LVIDs:         3.10 cm   LV e' medial:    40.02 cm/s LV PW:         0.70 cm   LV E/e' medial:  1.7 LV IVS:  0.70 cm   LV e' lateral:   13.30 cm/s LVOT diam:     1.90 cm   LV E/e' lateral: 5.2 LV SV:         47 LV SV Index:   28 LVOT Area:     2.84 cm   RIGHT VENTRICLE             IVC RV Basal diam:  2.70 cm     IVC diam: 0.80 cm RV Mid diam:    2.40 cm RV S prime:     13.20 cm/s TAPSE (M-mode): 1.8 cm  LEFT ATRIUM             Index        RIGHT ATRIUM           Index LA diam:        3.00 cm 1.78 cm/m   RA Pressure: 3.00 mmHg LA Vol (A2C):   41.0 ml 24.37 ml/m  RA Area:     9.40 cm LA Vol (A4C):   38.5 ml 22.89 ml/m  RA Volume:   18.70 ml  11.12 ml/m LA Biplane Vol: 39.8 ml 23.66 ml/m AORTIC VALVE AV Area (Vmax):    2.09 cm AV Area (Vmean):   1.97 cm AV Area (VTI):     2.04 cm AV Vmax:           113.00 cm/s AV Vmean:          78.400 cm/s AV VTI:            0.229 m AV Peak Grad:      5.1 mmHg AV Mean Grad:      3.0 mmHg LVOT Vmax:         83.30 cm/s LVOT Vmean:        54.500 cm/s LVOT VTI:          0.165 m LVOT/AV VTI ratio: 0.72  AORTA Ao Root diam: 3.20 cm Ao Asc diam:  3.30  cm  MITRAL VALVE               TRICUSPID VALVE MV Area (PHT): 4.41 cm    Estimated RAP:  3.00 mmHg MV Decel Time: 172 msec MV E velocity: 69.10 cm/s  SHUNTS MV A velocity: 74.30 cm/s  Systemic VTI:  0.16 m MV E/A ratio:  0.93        Systemic Diam: 1.90 cm  Dalton McleanMD Electronically signed by Ezra Kanner Signature Date/Time: 04/13/2024/5:24:47 PM    Final    MONITORS  LONG TERM MONITOR (3-14 DAYS) 01/21/2021  Narrative  No significant arrhythmias   Patch Wear Time:  14 days and 0 hours (2022-06-07T11:32:04-399 to 2022-06-21T11:32:14-0400)  Patient had a min HR of 58 bpm, max HR of 147 bpm, and avg HR of 89 bpm. Predominant underlying rhythm was Sinus Rhythm. Slight P wave morphology changes were noted. 1 run of Supraventricular Tachycardia occurred lasting 8 beats with a max rate of 107 bpm (avg 102 bpm). Isolated SVEs were rare (<1.0%), SVE Couplets were rare (<1.0%), and SVE Triplets were rare (<1.0%). Isolated VEs were rare (<1.0%), VE Couplets were rare (<1.0%), and no VE Triplets were present. 27 patient triggered events corresponding to sinus rhythm +/- PACs/PVCs   CT SCANS  CT CARDIAC SCORING (SELF PAY ONLY) 01/23/2021  Addendum 01/23/2021  1:26 PM ADDENDUM REPORT: 01/23/2021 13:24  CLINICAL DATA:  Risk stratification: 53 Year-old White Female  EXAM: Coronary Calcium  Score  TECHNIQUE: The patient was scanned on a  Office Manager. Axial non-contrast 3 mm slices were carried out through the heart. The data set was analyzed on a dedicated work station and scored using the Agatson method.  FINDINGS: Non-cardiac: See separate report from [facility].  Ascending Aorta: Normal caliber.  Pericardium: Normal.  Coronary arteries: Normal origins.  Coronary Calcium  Score:  Left main: 0  Left anterior descending artery: 0  Left circumflex artery: 0  Right coronary artery: 0  Total: 0  Percentile: 1st for age, sex, and race matched  control.  IMPRESSION: 1. Coronary calcium  score of 0. This was 1st percentile for age, gender, and race matched controls.  RECOMMENDATIONS:  Coronary artery calcium  (CAC) score is a strong predictor of incident coronary heart disease (CHD) and provides predictive information beyond traditional risk factors. CAC scoring is reasonable to use in the decision to withhold, postpone, or initiate statin therapy in intermediate-risk or selected borderline-risk asymptomatic adults (age 3-75 years and LDL-C >=70 to <190 mg/dL) who do not have diabetes or established atherosclerotic cardiovascular disease (ASCVD).* In intermediate-risk (10-year ASCVD risk >=7.5% to <20%) adults or selected borderline-risk (10-year ASCVD risk >=5% to <7.5%) adults in whom a CAC score is measured for the purpose of making a treatment decision the following recommendations have been made:  If CAC = 0, it is reasonable to withhold statin therapy and reassess in 5 to 10 years, as long as higher risk conditions are absent (diabetes mellitus, family history of premature CHD in first degree relatives (males <55 years; females <65 years), cigarette smoking, LDL >=190 mg/dL or other independent risk factors).  If CAC is 1 to 99, it is reasonable to initiate statin therapy for patients ?53 years of age.  If CAC is >=100 or >=75th percentile, it is reasonable to initiate statin therapy at any age.  Cardiology referral should be considered for patients with CAC scores =400 or >=75th percentile.  *2018 AHA/ACC/AACVPR/AAPA/ABC/ACPM/ADA/AGS/APhA/ASPC/NLA/PCNA Guideline on the Management of Blood Cholesterol: A Report of the American College of Cardiology/American Heart Association Task Force on Clinical Practice Guidelines. J Am Coll Cardiol. 2019;73(24):3168-3209.  Stanly Leavens, MD   Electronically Signed By: Stanly Leavens MD On: 01/23/2021 13:24  Narrative EXAM: OVER-READ INTERPRETATION   CT CHEST  The following report is an over-read performed by radiologist Dr. Juliene Balder of [facility] on 01/23/2021. This over-read does not include interpretation of cardiac or coronary anatomy or pathology. The coronary calcium  score/coronary CTA interpretation by the cardiologist is attached.  COMPARISON:  Chest CT 12/12/2016  FINDINGS: Vascular: Normal caliber of the visualized thoracic aorta. Heart size is normal.  Mediastinum/Nodes: Normal appearance.  Lungs/Pleura: Several small nodules throughout the visualized lungs are stable since 2018. Index nodule at the left lung base on sequence 3 image 39 measures 4 mm and stable. No significant pleural fluid. Lung parenchyma has areas of mosaic attenuation that could be related to air trapping or post inflammatory changes.  Upper Abdomen: Normal  Musculoskeletal: No acute bone abnormality.  IMPRESSION: 1. No acute abnormality in the extracardiac structures. 2. Stable small bilateral pulmonary nodules. These are compatible with benign nodules based on the stability.  Electronically Signed: By: Juliene Balder M.D. On: 01/23/2021 12:32     ______________________________________________________________________________________________       Current Reported Medications:.    No outpatient medications have been marked as taking for the 06/01/24 encounter (Appointment) with Delmore Sear D, NP.    Physical Exam:    VS:  There were no vitals taken for this  visit.   Wt Readings from Last 3 Encounters:  12/25/23 148 lb (67.1 kg)  03/25/21 144 lb (65.3 kg)  01/23/21 139 lb (63 kg)    GEN: Well nourished, well developed in no acute distress NECK: No JVD; No carotid bruits CARDIAC: ***RRR, no murmurs, rubs, gallops RESPIRATORY:  Clear to auscultation without rales, wheezing or rhonchi  ABDOMEN: Soft, non-tender, non-distended EXTREMITIES:  No edema; No acute deformity     Asessement and Plan:.     ***      Disposition: F/u with ***  Signed, Seretha Estabrooks D Neave Lenger, NP

## 2024-06-01 ENCOUNTER — Ambulatory Visit: Admitting: Cardiology

## 2024-06-05 ENCOUNTER — Other Ambulatory Visit: Payer: Self-pay | Admitting: Behavioral Health

## 2024-06-13 ENCOUNTER — Ambulatory Visit: Admitting: Behavioral Health

## 2024-06-20 ENCOUNTER — Encounter: Payer: Self-pay | Admitting: *Deleted

## 2024-06-22 ENCOUNTER — Ambulatory Visit: Attending: Emergency Medicine | Admitting: Emergency Medicine

## 2024-06-22 NOTE — Progress Notes (Deleted)
  Cardiology Office Note:    Date:  06/22/2024  ID:  Jenna Zamora, DOB [birth year], MRN [id number] PCP: Orpha Yancey LABOR, MD  [facility] Providers Cardiologist:  Lonni LITTIE Nanas, MD { Click to update primary MD,subspecialty MD or APP then REFRESH:1}    {Click to Open Review  :1}   Patient Profile:       Chief Complaint: *** History of Present Illness:  Jenna Zamora is a 53 y.o. female with visit-pertinent history of hypertension, hyperlipidemia, tobacco use, COPD, rheumatoid arthritis.    Patient first seen by Dr. Nanas on 12/26/2020 for evaluation of hyperlipidemia.  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  On 09/11/2020 her LDL cholesterol was 123.  Patient reported to be experiencing some chest pains and shortness of breath.  Patient also reported syncopal events, 2 weeks Zio patch was recommended.   Cardiac monitor in 12/2020 indicated an average heart rate of 89 bpm ranging 58 247 bpm.  Predominant underlying rhythm was sinus rhythm, there was 1 run of supraventricular tachycardia lasting 8 beats with a max rate of 107 bpm.  CT cardiac scoring indicated a coronary calcium  score of 0, this was 1st percentile for age, gender and race matched controls.  Treadmill MPI on 01/23/2021 indicated LVEF of 55 to 65%, no ST segment deviation was noted during stress, no T wave inversion was noted during stress, study was read as normal and low risk.  Last seen in clinic on 12/25/2023.  She denies any chest pains.  She did note some increased lower extremity edema.  Echocardiogram on 04/13/2024 showed LVEF 55%, no RWMA, grade 1 DD, RV function and size normal, trivial MR.  Discussed the use of AI scribe software for clinical note transcription with the patient, who gave verbal consent to proceed.  History of Present Illness     Review of systems:  Please see the history of present illness. All other systems are  reviewed and otherwise negative. ***      Studies Reviewed:        ***  Risk Assessment/Calculations:   {Does this patient have ATRIAL FIBRILLATION?:[id number]} No BP recorded.  {Refresh Note OR Click here to enter BP  :1}***        Physical Exam:   VS:  There were no vitals taken for this visit.   Wt Readings from Last 3 Encounters:  12/25/23 148 lb (67.1 kg)  03/25/21 144 lb (65.3 kg)  01/23/21 139 lb (63 kg)    GEN: Well nourished, well developed in no acute distress NECK: No JVD; No carotid bruits CARDIAC: ***RRR, no murmurs, rubs, gallops RESPIRATORY:  Clear to auscultation without rales, wheezing or rhonchi  ABDOMEN: Soft, non-tender, non-distended EXTREMITIES:  No edema; No acute deformity ***      Assessment and Plan:    Assessment and Plan Assessment & Plan      {Are you ordering a CV Procedure (e.g. stress test, cath, DCCV, TEE, etc)?   Press F2        :[id number]}  Dispo:  No follow-ups on file.  Signed, Lum LITTIE Louis, NP

## 2024-08-08 ENCOUNTER — Ambulatory Visit: Admitting: Behavioral Health

## 2024-08-23 ENCOUNTER — Ambulatory Visit: Admitting: Behavioral Health

## 2024-08-23 NOTE — Progress Notes (Signed)
 PT did not show for scheduled appointment today and did not provide 24-hour notice.  However, there has been inclement weather and all roadway's are clear.  Will waive any additional charges for missed appointment today.

## 2024-08-30 ENCOUNTER — Other Ambulatory Visit: Payer: Self-pay | Admitting: Behavioral Health
